# Patient Record
Sex: Female | Born: 1990 | Race: Black or African American | Hispanic: No | Marital: Single | Smoking: Never smoker
Health system: Southern US, Community
[De-identification: ages and names within clinical notes are randomized; demographics above are authoritative.]

## PROBLEM LIST (undated history)

## (undated) ENCOUNTER — Ambulatory Visit (HOSPITAL_COMMUNITY): Source: Home / Self Care

## (undated) DIAGNOSIS — IMO0002 Reserved for concepts with insufficient information to code with codable children: Secondary | ICD-10-CM

## (undated) DIAGNOSIS — R87619 Unspecified abnormal cytological findings in specimens from cervix uteri: Secondary | ICD-10-CM

## (undated) DIAGNOSIS — A599 Trichomoniasis, unspecified: Secondary | ICD-10-CM

## (undated) DIAGNOSIS — E876 Hypokalemia: Secondary | ICD-10-CM

## (undated) HISTORY — DX: Unspecified abnormal cytological findings in specimens from cervix uteri: R87.619

## (undated) HISTORY — PX: NO PAST SURGERIES: SHX2092

## (undated) HISTORY — DX: Reserved for concepts with insufficient information to code with codable children: IMO0002

---
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
## 2008-06-01 ENCOUNTER — Emergency Department (HOSPITAL_COMMUNITY): Admission: EM | Admit: 2008-06-01 | Discharge: 2008-06-01 | Payer: Self-pay | Admitting: Emergency Medicine

## 2008-12-22 ENCOUNTER — Observation Stay (HOSPITAL_COMMUNITY): Admission: EM | Admit: 2008-12-22 | Discharge: 2008-12-22 | Payer: Self-pay | Admitting: Emergency Medicine

## 2009-03-09 ENCOUNTER — Ambulatory Visit: Payer: Self-pay | Admitting: Obstetrics and Gynecology

## 2009-03-09 LAB — CONVERTED CEMR LAB: hCG, Beta Chain, Quant, S: <2 milliintl units/mL

## 2009-03-15 ENCOUNTER — Ambulatory Visit (HOSPITAL_COMMUNITY): Admission: RE | Admit: 2009-03-15 | Discharge: 2009-03-15 | Payer: Self-pay | Admitting: Family Medicine

## 2009-03-23 ENCOUNTER — Ambulatory Visit: Payer: Self-pay | Admitting: Obstetrics and Gynecology

## 2009-07-13 ENCOUNTER — Ambulatory Visit: Payer: Self-pay | Admitting: Obstetrics and Gynecology

## 2009-07-13 LAB — CONVERTED CEMR LAB
Chlamydia, DNA Probe: POSITIVE — AB
hCG, Beta Chain, Quant, S: 79050.9 milliintl units/mL

## 2009-07-14 ENCOUNTER — Encounter: Payer: Self-pay | Admitting: Obstetrics and Gynecology

## 2009-07-18 ENCOUNTER — Ambulatory Visit (HOSPITAL_COMMUNITY): Admission: RE | Admit: 2009-07-18 | Discharge: 2009-07-18 | Payer: Self-pay | Admitting: Family Medicine

## 2009-07-24 ENCOUNTER — Inpatient Hospital Stay (HOSPITAL_COMMUNITY): Admission: AD | Admit: 2009-07-24 | Discharge: 2009-07-24 | Payer: Self-pay | Admitting: Obstetrics & Gynecology

## 2009-07-24 ENCOUNTER — Ambulatory Visit: Payer: Self-pay | Admitting: Advanced Practice Midwife

## 2009-08-24 ENCOUNTER — Ambulatory Visit: Payer: Self-pay | Admitting: Obstetrics and Gynecology

## 2009-08-24 LAB — CONVERTED CEMR LAB: GC Probe Amp, Genital: NEGATIVE

## 2009-08-25 ENCOUNTER — Encounter: Payer: Self-pay | Admitting: Obstetrics and Gynecology

## 2009-08-25 LAB — CONVERTED CEMR LAB
Clue Cells Wet Prep HPF POC: NONE SEEN
Trich, Wet Prep: NONE SEEN
Yeast Wet Prep HPF POC: NONE SEEN

## 2009-10-10 ENCOUNTER — Inpatient Hospital Stay (HOSPITAL_COMMUNITY): Admission: AD | Admit: 2009-10-10 | Discharge: 2009-10-10 | Payer: Self-pay | Admitting: Obstetrics & Gynecology

## 2010-02-27 ENCOUNTER — Inpatient Hospital Stay (HOSPITAL_COMMUNITY)
Admission: AD | Admit: 2010-02-27 | Discharge: 2010-03-01 | Payer: Self-pay | Source: Home / Self Care | Attending: Family Medicine | Admitting: Family Medicine

## 2010-02-28 ENCOUNTER — Ambulatory Visit: Admit: 2010-02-28 | Payer: Self-pay | Admitting: Obstetrics & Gynecology

## 2010-02-28 LAB — CBC
MCHC: 34.9 g/dL (ref 30.0–36.0)
MCV: 91 fL (ref 78.0–100.0)
Platelets: 181 10*3/uL (ref 150–400)
RDW: 13.2 % (ref 11.5–15.5)
WBC: 8.3 10*3/uL (ref 4.0–10.5)

## 2010-02-28 LAB — ABO/RH: ABO/RH(D): O POS

## 2010-02-28 LAB — RPR: RPR Ser Ql: NONREACTIVE

## 2010-04-22 LAB — URINALYSIS, ROUTINE W REFLEX MICROSCOPIC
Ketones, ur: NEGATIVE mg/dL
Nitrite: NEGATIVE

## 2010-04-25 ENCOUNTER — Ambulatory Visit: Payer: Self-pay | Admitting: Obstetrics and Gynecology

## 2010-05-09 ENCOUNTER — Ambulatory Visit: Payer: Self-pay | Admitting: Obstetrics and Gynecology

## 2010-05-09 LAB — COMPREHENSIVE METABOLIC PANEL
ALT: 10 U/L (ref 0–35)
Alkaline Phosphatase: 67 U/L (ref 39–117)
Calcium: 9.3 mg/dL (ref 8.4–10.5)
Chloride: 106 mEq/L (ref 96–112)
Creatinine, Ser: 0.88 mg/dL (ref 0.4–1.2)
GFR calc non Af Amer: >60 mL/min (ref >60)
Potassium: 4.2 mEq/L (ref 3.5–5.1)
Sodium: 137 mEq/L (ref 135–145)

## 2010-05-09 LAB — WET PREP, GENITAL
Trich, Wet Prep: NONE SEEN
Yeast Wet Prep HPF POC: NONE SEEN

## 2010-05-09 LAB — URINE MICROSCOPIC-ADD ON

## 2010-05-09 LAB — CBC
Hemoglobin: 13.8 g/dL (ref 12.0–15.0)
MCHC: 34.4 g/dL (ref 30.0–36.0)
Platelets: 247 10*3/uL (ref 150–400)
RBC: 4.22 MIL/uL (ref 3.87–5.11)

## 2010-05-09 LAB — URINALYSIS, ROUTINE W REFLEX MICROSCOPIC
Glucose, UA: NEGATIVE mg/dL
Urobilinogen, UA: 0.2 mg/dL (ref 0.0–1.0)
pH: 7 (ref 5.0–8.0)

## 2010-05-09 LAB — DIFFERENTIAL
Basophils Absolute: 0.1 10*3/uL (ref 0.0–0.1)
Eosinophils Absolute: 0.1 10*3/uL (ref 0.0–0.7)
Monocytes Absolute: 1.3 10*3/uL — ABNORMAL HIGH (ref 0.1–1.0)
Neutro Abs: 7.6 10*3/uL (ref 1.7–7.7)
Neutrophils Relative %: 72 % (ref 43–77)

## 2010-05-09 LAB — GC/CHLAMYDIA PROBE AMP, GENITAL
Chlamydia, DNA Probe: POSITIVE — AB
GC Probe Amp, Genital: NEGATIVE

## 2010-05-09 LAB — LIPASE, BLOOD: Lipase: 24 U/L (ref 11–59)

## 2010-05-09 LAB — POCT PREGNANCY, URINE: Preg Test, Ur: NEGATIVE

## 2010-05-16 LAB — URINALYSIS, ROUTINE W REFLEX MICROSCOPIC
Bilirubin Urine: NEGATIVE
Glucose, UA: NEGATIVE mg/dL
Ketones, ur: NEGATIVE mg/dL
Nitrite: NEGATIVE
Protein, ur: NEGATIVE mg/dL
Specific Gravity, Urine: 1.022 (ref 1.005–1.030)
Urobilinogen, UA: 1 mg/dL (ref 0.0–1.0)

## 2010-05-16 LAB — URINE MICROSCOPIC-ADD ON

## 2010-05-24 ENCOUNTER — Other Ambulatory Visit: Payer: Self-pay | Admitting: Obstetrics and Gynecology

## 2010-05-24 ENCOUNTER — Ambulatory Visit (INDEPENDENT_AMBULATORY_CARE_PROVIDER_SITE_OTHER): Payer: Self-pay | Admitting: Obstetrics and Gynecology

## 2010-05-24 DIAGNOSIS — N898 Other specified noninflammatory disorders of vagina: Secondary | ICD-10-CM

## 2010-05-24 LAB — POCT PREGNANCY, URINE: Preg Test, Ur: NEGATIVE

## 2010-05-25 NOTE — Group Therapy Note (Signed)
Sara Arroyo, ASANO NO.:  [id number]  MEDICAL RECORD NO.:  [id number]           PATIENT TYPE:  A  LOCATION:  [facility]                   FACILITY:  [facility]  PHYSICIAN:  Argentina Donovan, MD        DATE OF BIRTH:  [birth year]  DATE OF SERVICE:  05/24/2010                                 CLINIC NOTE  The patient is a 20 year old African American female, who delivered a baby by vacuum extraction on January 24 of this year.  Other than that, seems to have an uneventful delivery.  The patient has not nursed, has no real complaints.  On examination, external genitalia is normal.  BUS within normal limits. Vagina, clean with a creamy discharge.  The external buttocks had several small __________ raised lesions, very tiny, indicating molluscum contagiosum.  __________ that would go away.  I asked if she wanted anything to prevent pregnancy, she said no she __________ getting pregnant.  I asked her if she had had intercourse since the baby, she said she had it one time, so we finally settled on Depo-Provera, which she is going to get before she leaves.  I told her that the lesions would go away and we did a test for GC, Chlamydia, as well as wet prep.  IMPRESSION:  Postpartum exam, satisfactory rule out other infectious disease, sexually transmitted disease, molluscum contagiosum present.          ______________________________ Argentina Donovan, MD    PR/MEDQ  D:  05/24/2010  T:  05/25/2010  Job:  [id number]

## 2010-06-23 ENCOUNTER — Inpatient Hospital Stay (HOSPITAL_COMMUNITY)
Admission: AD | Admit: 2010-06-23 | Discharge: 2010-06-23 | Disposition: A | Discharge status: Home / Self Care | Payer: Medicaid Other | Source: Ambulatory Visit | Attending: Obstetrics & Gynecology | Admitting: Obstetrics & Gynecology

## 2010-06-23 DIAGNOSIS — K5289 Other specified noninfective gastroenteritis and colitis: Secondary | ICD-10-CM | POA: Insufficient documentation

## 2010-06-23 DIAGNOSIS — R112 Nausea with vomiting, unspecified: Secondary | ICD-10-CM | POA: Insufficient documentation

## 2010-06-23 LAB — URINE MICROSCOPIC-ADD ON

## 2010-06-23 LAB — COMPREHENSIVE METABOLIC PANEL
Albumin: 4.6 g/dL (ref 3.5–5.2)
BUN: 12 mg/dL (ref 6–23)
Calcium: 10 mg/dL (ref 8.4–10.5)
Creatinine, Ser: 0.92 mg/dL (ref 0.4–1.2)
Potassium: 3.4 mEq/L — ABNORMAL LOW (ref 3.5–5.1)
Total Protein: 8.1 g/dL (ref 6.0–8.3)

## 2010-06-23 LAB — CBC
MCH: 31 pg (ref 26.0–34.0)
MCHC: 34.9 g/dL (ref 30.0–36.0)
MCV: 89.1 fL (ref 78.0–100.0)
Platelets: 269 10*3/uL (ref 150–400)
RDW: 12.6 % (ref 11.5–15.5)
WBC: 5.4 10*3/uL (ref 4.0–10.5)

## 2010-06-23 LAB — WET PREP, GENITAL
Clue Cells Wet Prep HPF POC: NONE SEEN
Trich, Wet Prep: NONE SEEN
Yeast Wet Prep HPF POC: NONE SEEN

## 2010-06-23 LAB — URINALYSIS, ROUTINE W REFLEX MICROSCOPIC
Nitrite: NEGATIVE
Specific Gravity, Urine: >1.03 — ABNORMAL HIGH (ref 1.005–1.030)
pH: 6 (ref 5.0–8.0)

## 2010-07-13 ENCOUNTER — Ambulatory Visit: Payer: Self-pay | Admitting: Family Medicine

## 2010-08-09 ENCOUNTER — Ambulatory Visit: Payer: Self-pay

## 2010-08-20 ENCOUNTER — Ambulatory Visit (INDEPENDENT_AMBULATORY_CARE_PROVIDER_SITE_OTHER): Payer: Medicaid Other | Admitting: *Deleted

## 2010-08-20 VITALS — BP 108/65 | HR 69 | Temp 100.4°F | Wt 113.7 lb

## 2010-08-20 DIAGNOSIS — Z3049 Encounter for surveillance of other contraceptives: Secondary | ICD-10-CM

## 2010-08-20 DIAGNOSIS — IMO0001 Reserved for inherently not codable concepts without codable children: Secondary | ICD-10-CM

## 2010-08-20 MED ORDER — MEDROXYPROGESTERONE ACETATE 150 MG/ML IM SUSP
150.0000 mg | Freq: Once | INTRAMUSCULAR | Status: AC
  Administered 2010-08-20: 150 mg via INTRAMUSCULAR

## 2010-11-09 ENCOUNTER — Ambulatory Visit: Payer: Medicaid Other

## 2010-11-12 ENCOUNTER — Ambulatory Visit: Payer: Medicaid Other

## 2010-11-15 ENCOUNTER — Encounter: Payer: Self-pay | Admitting: *Deleted

## 2010-11-16 ENCOUNTER — Ambulatory Visit: Payer: Medicaid Other

## 2010-11-17 ENCOUNTER — Emergency Department (HOSPITAL_COMMUNITY)
Admission: EM | Admit: 2010-11-17 | Discharge: 2010-11-17 | Disposition: A | Discharge status: Home / Self Care | Payer: Medicaid Other | Attending: Emergency Medicine | Admitting: Emergency Medicine

## 2010-11-17 DIAGNOSIS — R0982 Postnasal drip: Secondary | ICD-10-CM | POA: Insufficient documentation

## 2010-11-17 DIAGNOSIS — R05 Cough: Secondary | ICD-10-CM | POA: Insufficient documentation

## 2010-11-17 DIAGNOSIS — J329 Chronic sinusitis, unspecified: Secondary | ICD-10-CM | POA: Insufficient documentation

## 2010-11-17 DIAGNOSIS — R509 Fever, unspecified: Secondary | ICD-10-CM | POA: Insufficient documentation

## 2010-11-17 DIAGNOSIS — J3489 Other specified disorders of nose and nasal sinuses: Secondary | ICD-10-CM | POA: Insufficient documentation

## 2010-11-17 DIAGNOSIS — H9209 Otalgia, unspecified ear: Secondary | ICD-10-CM | POA: Insufficient documentation

## 2010-11-17 DIAGNOSIS — R059 Cough, unspecified: Secondary | ICD-10-CM | POA: Insufficient documentation

## 2010-11-17 DIAGNOSIS — R51 Headache: Secondary | ICD-10-CM | POA: Insufficient documentation

## 2010-11-19 ENCOUNTER — Ambulatory Visit (INDEPENDENT_AMBULATORY_CARE_PROVIDER_SITE_OTHER): Payer: Medicaid Other | Admitting: *Deleted

## 2010-11-19 VITALS — BP 101/67 | HR 67

## 2010-11-19 DIAGNOSIS — Z3049 Encounter for surveillance of other contraceptives: Secondary | ICD-10-CM

## 2010-11-19 MED ORDER — MEDROXYPROGESTERONE ACETATE 150 MG/ML IM SUSP
150.0000 mg | Freq: Once | INTRAMUSCULAR | Status: AC
  Administered 2010-11-19: 150 mg via INTRAMUSCULAR

## 2011-01-04 ENCOUNTER — Telehealth: Payer: Self-pay | Admitting: *Deleted

## 2011-01-04 NOTE — Telephone Encounter (Signed)
Pt left message that she has questions about the Depo.  She requests call back ASAP.  She did not state her questions.  I returned pt call and left message that I will call her again before we close @ 1200.  If I do not reach her, we will re-open @ 0800 on Monday.

## 2011-01-04 NOTE — Telephone Encounter (Signed)
I left another message for pt that I was trying to contact her and discuss her concerns/questions.  Please call back to nurse voice mail and state her specific questions. She will be contacted on Monday.

## 2011-01-07 NOTE — Telephone Encounter (Signed)
Called [place] and left a message we are returning your call, please call again and we will try to answer your questions.

## 2011-01-07 NOTE — Telephone Encounter (Signed)
Patient called and left a message on nurse voicemail on Friday, November 30 at 12:39pm that she was returning our call. Call was retrieved today.

## 2011-01-08 NOTE — Telephone Encounter (Signed)
Telephone pt at home # left message to return call.

## 2011-01-10 ENCOUNTER — Telehealth: Payer: Self-pay | Admitting: *Deleted

## 2011-01-10 NOTE — Telephone Encounter (Signed)
Sara Arroyo called and left a message stating she is returning a 3rd or 4th call- re: her question is about the depoprovera shot she is on- states she has been on it since April- has not had a period since then, now I'm bleeding heavy all of a sudden- I want to know why- I 'm letting you know I do have an appointment set up for  January, but I want to know now.  I was told that after my first or second injection I would not have any more periods. I have headaches , back pains, Please call me if and If I don't answer leave a message what you think it is.

## 2011-01-10 NOTE — Telephone Encounter (Signed)
Called [place] back and left a message we are returning your call, sorry we missed you again, but we need to talk to you instead of just leaving a message, however it is normal to still have a period after starting depoprovera- not everyone stops having periods. Please call clinic to discuss with [place] your complaints of heavy bleeding

## 2011-01-11 NOTE — Telephone Encounter (Signed)
Called pt and left message as instructed by pt and informed her that it is normal to still have a period while on the depo provera injection.  And if she has any further questions to please give [place] a call back at the clinics.

## 2011-02-11 ENCOUNTER — Ambulatory Visit (INDEPENDENT_AMBULATORY_CARE_PROVIDER_SITE_OTHER): Payer: Medicaid Other | Admitting: *Deleted

## 2011-02-11 VITALS — BP 113/63 | HR 92

## 2011-02-11 DIAGNOSIS — Z3049 Encounter for surveillance of other contraceptives: Secondary | ICD-10-CM

## 2011-02-11 MED ORDER — MEDROXYPROGESTERONE ACETATE 150 MG/ML IM SUSP
150.0000 mg | Freq: Once | INTRAMUSCULAR | Status: AC
  Administered 2011-02-11: 150 mg via INTRAMUSCULAR

## 2011-04-29 ENCOUNTER — Ambulatory Visit (INDEPENDENT_AMBULATORY_CARE_PROVIDER_SITE_OTHER): Payer: Medicaid Other | Admitting: *Deleted

## 2011-04-29 VITALS — BP 110/67 | HR 72 | Temp 98.2°F | Wt 111.3 lb

## 2011-04-29 DIAGNOSIS — Z3049 Encounter for surveillance of other contraceptives: Secondary | ICD-10-CM

## 2011-04-29 MED ORDER — MEDROXYPROGESTERONE ACETATE 150 MG/ML IM SUSP
150.0000 mg | Freq: Once | INTRAMUSCULAR | Status: AC
  Administered 2011-04-29: 150 mg via INTRAMUSCULAR

## 2011-04-30 ENCOUNTER — Telehealth: Payer: Self-pay | Admitting: *Deleted

## 2011-04-30 NOTE — Telephone Encounter (Signed)
Pt left message for a nurse to call back ASAP.

## 2011-05-01 NOTE — Telephone Encounter (Signed)
Returned call to pt and left message to leave a new voice mail message and state her question or problem. We will call her back.

## 2011-05-02 NOTE — Telephone Encounter (Signed)
Called pt and left message stating that this is our second attempt in returning your call if she has concerns or questions to please give [place] a call at the clinics.

## 2011-07-15 ENCOUNTER — Ambulatory Visit: Payer: Medicaid Other | Admitting: Physician Assistant

## 2011-07-16 ENCOUNTER — Ambulatory Visit (INDEPENDENT_AMBULATORY_CARE_PROVIDER_SITE_OTHER): Payer: Medicaid Other | Admitting: General Practice

## 2011-07-16 VITALS — BP 99/57 | HR 72 | Ht 61.0 in | Wt 107.0 lb

## 2011-07-16 DIAGNOSIS — Z309 Encounter for contraceptive management, unspecified: Secondary | ICD-10-CM

## 2011-07-16 DIAGNOSIS — Z3049 Encounter for surveillance of other contraceptives: Secondary | ICD-10-CM

## 2011-07-16 MED ORDER — MEDROXYPROGESTERONE ACETATE 150 MG/ML IM SUSP
150.0000 mg | Freq: Once | INTRAMUSCULAR | Status: AC
  Administered 2011-07-16: 150 mg via INTRAMUSCULAR

## 2011-08-30 ENCOUNTER — Ambulatory Visit (INDEPENDENT_AMBULATORY_CARE_PROVIDER_SITE_OTHER): Payer: Medicaid Other | Admitting: Advanced Practice Midwife

## 2011-08-30 ENCOUNTER — Other Ambulatory Visit (HOSPITAL_COMMUNITY)
Admission: RE | Admit: 2011-08-30 | Discharge: 2011-08-30 | Disposition: A | Discharge status: Home / Self Care | Payer: Medicaid Other | Source: Ambulatory Visit | Attending: Advanced Practice Midwife | Admitting: Advanced Practice Midwife

## 2011-08-30 VITALS — BP 113/68 | HR 89 | Temp 99.1°F | Ht 61.0 in | Wt 107.0 lb

## 2011-08-30 DIAGNOSIS — Z113 Encounter for screening for infections with a predominantly sexual mode of transmission: Secondary | ICD-10-CM | POA: Insufficient documentation

## 2011-08-30 DIAGNOSIS — Z01419 Encounter for gynecological examination (general) (routine) without abnormal findings: Secondary | ICD-10-CM | POA: Insufficient documentation

## 2011-08-30 DIAGNOSIS — R87810 Cervical high risk human papillomavirus (HPV) DNA test positive: Secondary | ICD-10-CM | POA: Insufficient documentation

## 2011-08-30 DIAGNOSIS — R634 Abnormal weight loss: Secondary | ICD-10-CM

## 2011-08-30 LAB — TSH: TSH: 1.243 u[IU]/mL (ref 0.350–4.500)

## 2011-08-30 MED ORDER — MEDROXYPROGESTERONE ACETATE 150 MG/ML IM SUSP: 150.0000 mg | Freq: Once | INTRAMUSCULAR | Status: DC

## 2011-08-30 NOTE — Patient Instructions (Addendum)
Health Maintenance, 18- to 23-Year-Old SCHOOL PERFORMANCE After high school completion, the young adult may be attending college, technical or vocational school, or entering the military or the work force. SOCIAL AND EMOTIONAL DEVELOPMENT The young adult establishes adult relationships and explores sexual identity. Young adults may be living at home or in a college dorm or apartment. Increasing independence is important with young adults. Throughout adolescence, teens should assume responsibility of their own health care. IMMUNIZATIONS Most young adults should be fully vaccinated. A booster dose of Tdap (tetanus, diphtheria, and pertussis, or "whooping cough"), a dose of meningococcal vaccine to protect against a certain type of bacterial meningitis, hepatitis A, human papillomarvirus (HPV), chickenpox, or measles vaccines may be indicated, if not given at an earlier age. Annual influenza or "flu" vaccination should be considered during flu season.  TESTING Annual screening for vision and hearing problems is recommended. Vision should be screened objectively at least once between 18 and 23 years of age. The young adult may be screened for anemia or tuberculosis. Young adults should have a blood test to check for high cholesterol during this time period. Young adults should be screened for use of alcohol and drugs. If the young adult is sexually active, screening for sexually transmitted infections, pregnancy, or HIV may be performed. Screening for cervical cancer should be performed within 3 years of beginning sexual activity. NUTRITION AND ORAL HEALTH  Adequate calcium intake is important. Consume 3 servings of low-fat milk and dairy products daily. For those who do not drink milk or consume dairy products, calcium enriched foods, such as juice, bread, or cereal, dark, leafy greens, or canned fish are alternate sources of calcium.   Drink plenty of water. Limit fruit juice to 8 to 12 ounces per day.  Avoid sugary beverages or sodas.   Discourage skipping meals, especially breakfast. Teens should eat a good variety of vegetables and fruits, as well as lean meats.   Avoid high fat, high salt, and high sugar foods, such as candy, chips, and cookies.   Encourage young adults to participate in meal planning and preparation.   Eat meals together as a family whenever possible. Encourage conversation at mealtime.   Limit fast food choices and eating out at restaurants.   Brush teeth twice a day and floss.   Schedule dental exams twice a year.  SLEEP Regular sleep habits are important. PHYSICAL, SOCIAL, AND EMOTIONAL DEVELOPMENT  One hour of regular physical activity daily is recommended. Continue to participate in sports.   Encourage young adults to develop their own interests and consider community service or volunteerism.   Provide guidance to the young adult in making decisions about college and work plans.   Make sure that young adults know that they should never be in a situation that makes them uncomfortable, and they should tell partners if they do not want to engage in sexual activity.   Talk to the young adult about body image. Eating disorders may be noted at this time. Young adults may also be concerned about being overweight. Monitor the young adult for weight gain or loss.   Mood disturbances, depression, anxiety, alcoholism, or attention problems may be noted in young adults. Talk to the caregiver if there are concerns about mental illness.   Negotiate limit setting and independent decision making.   Encourage the young adult to handle conflict without physical violence.   Avoid loud noises which may impair hearing.   Limit television and computer time to 2 hours per day.   Individuals who engage in excessive sedentary activity are more likely to become overweight.  RISK BEHAVIORS  Sexually active young adults need to take precautions against pregnancy and sexually  transmitted infections. Talk to young adults about contraception.   Provide a tobacco-free and drug-free environment for the young adult. Talk to the young adult about drug, tobacco, and alcohol use among friends or at friends' homes. Make sure the young adult knows that smoking tobacco or marijuana and taking drugs have health consequences and may impact brain development.   Teach the young adult about appropriate use of over-the-counter or prescription medicines.   Establish guidelines for driving and for riding with friends.   Talk to young adults about the risks of drinking and driving or boating. Encourage the young adult to call you if he or she or friends have been drinking or using drugs.   Remind young adults to wear seat belts at all times in cars and life vests in boats.   Young adults should always wear a properly fitted helmet when they are riding a bicycle.   Use caution with all-terrain vehicles (ATVs) or other motorized vehicles.   Do not keep handguns in the home. (If you do, the gun and ammunition should be locked separately and out of the young adult's access.)   Equip your home with smoke detectors and change the batteries regularly. Make sure all family members know the fire escape plans for your home.   Teach young adults not to swim alone and not to dive in shallow water.   All individuals should wear sunscreen that protects against UVA and UVB light with at least a sun protection factor (SPF) of 30 when out in the sun. This minimizes sun burning.  WHAT'S NEXT? Young adults should visit their pediatrician or family physician yearly. By young adulthood, health care should be transitioned to a family physician or internal medicine specialist. Sexually active females may want to begin annual physical exams with a gynecologist. Document Released: 04/18/2006 Document Revised: 01/10/2011 Document Reviewed: 05/08/2006 [employer] Patient Information 2012 [employer]. 

## 2011-08-30 NOTE — Progress Notes (Signed)
  Subjective:     Sara Arroyo is a 21 y.o. female here for a routine exam.  Current complaints: unable to gain weight. States she eats "alot" but cannot gain weight since giving birth early in 2012.  Personal health questionnaire reviewed: not asked.   Gynecologic History No LMP recorded. Patient has had an injection. Contraception: Depo-Provera injections Last Pap: 2011. 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 Results were:   Obstetric History OB History    Grav Para Term Preterm Abortions TAB SAB Ect Mult Living   2 1 1  1 1    1      # Outc Date GA Lbr Len/2nd Wgt Sex Del Anes PTL Lv   1 TAB 2008           2 TRM 1/12     VAC          The following portions of the patient's history were reviewed and updated as appropriate: allergies, current medications, past family history, past medical history, past social history, past surgical history and problem list.  Review of Systems Pertinent items are noted in HPI.    Objective:    General appearance: alert and no distress Neck: no adenopathy, no JVD, supple, symmetrical, trachea midline and thyroid not enlarged, symmetric, no tenderness/mass/nodules Breasts: normal appearance, no masses or tenderness Abdomen: soft, non-tender; bowel sounds normal; no masses,  no organomegaly Pelvic: cervix normal in appearance, external genitalia normal, no adnexal masses or tenderness, no cervical motion tenderness, rectovaginal septum normal, uterus normal size, shape, and consistency and vagina normal without discharge Skin: Skin color, texture, turgor normal. No rashes or lesions    Assessment:    Healthy female exam.  Unintentional inability to gain weight   Plan:  Discussed need to  Have TSH drawn.   Discussed need to have Family Doctor review her overall health history and evaluate the weight issue.  She has a [employer]cl[employer] group her daughter goes to so she will call them. Pap with cultures sent   Contraception:  Depo-Provera injections. Follow up in: 6 months.

## 2011-09-09 ENCOUNTER — Telehealth: Payer: Self-pay | Admitting: *Deleted

## 2011-09-09 NOTE — Telephone Encounter (Signed)
Message copied by Gerome Apley on Mon Sep 09, 2011 11:30 AM ------      Message from: [facility]      Created: Fri Sep 06, 2011  9:51 AM      Regarding: Not sure who calls pt Re: Abnormal pap,needs colpo       LGSIL pap.            Needs Colpo            GC/CHl both Negative            HR HPV  Positive            Thanks

## 2011-09-11 NOTE — Telephone Encounter (Signed)
Patient returned call. I informed her of the abnormal pap result and explained in detail the colposcopy procedure. Pt was upset and keep saying that she just didn't understand. I explained again and told her about HPV. Pt stated that she understands and will come to the colpo appt. Also aware that depo appt was changed.

## 2011-09-11 NOTE — Telephone Encounter (Signed)
Colpo appt has been made for 10/10/11 @ 1:15 pm. Pt will still be in her "window" for depo and will receive her shot at this appt also. Original depo appointment on 10/01/11 has been cancelled. LM for patient to return call to clinic for appt information. Pt needs to be advised of colpo appt and depo appt change.

## 2011-10-01 ENCOUNTER — Ambulatory Visit: Payer: Medicaid Other

## 2011-10-06 HISTORY — PX: COLPOSCOPY: SHX161

## 2011-10-10 ENCOUNTER — Ambulatory Visit (INDEPENDENT_AMBULATORY_CARE_PROVIDER_SITE_OTHER): Payer: Medicaid Other | Admitting: Obstetrics & Gynecology

## 2011-10-10 ENCOUNTER — Other Ambulatory Visit (HOSPITAL_COMMUNITY)
Admission: RE | Admit: 2011-10-10 | Discharge: 2011-10-10 | Disposition: A | Discharge status: Home / Self Care | Payer: Medicaid Other | Source: Ambulatory Visit | Attending: Obstetrics & Gynecology | Admitting: Obstetrics & Gynecology

## 2011-10-10 ENCOUNTER — Encounter: Payer: Self-pay | Admitting: Obstetrics & Gynecology

## 2011-10-10 VITALS — BP 104/67 | HR 93 | Temp 99.6°F | Ht 60.0 in | Wt 111.0 lb

## 2011-10-10 DIAGNOSIS — IMO0002 Reserved for concepts with insufficient information to code with codable children: Secondary | ICD-10-CM

## 2011-10-10 DIAGNOSIS — N870 Mild cervical dysplasia: Secondary | ICD-10-CM | POA: Insufficient documentation

## 2011-10-10 DIAGNOSIS — Z01812 Encounter for preprocedural laboratory examination: Secondary | ICD-10-CM

## 2011-10-10 DIAGNOSIS — R87612 Low grade squamous intraepithelial lesion on cytologic smear of cervix (LGSIL): Secondary | ICD-10-CM

## 2011-10-10 DIAGNOSIS — Z3049 Encounter for surveillance of other contraceptives: Secondary | ICD-10-CM

## 2011-10-10 MED ORDER — MEDROXYPROGESTERONE ACETATE 150 MG/ML IM SUSP
150.0000 mg | Freq: Once | INTRAMUSCULAR | Status: AC
  Administered 2011-10-10: 150 mg via INTRAMUSCULAR

## 2011-10-10 NOTE — Addendum Note (Signed)
Addended by: Sherre Lain A on: 10/10/2011 02:41 PM   Modules accepted: Orders

## 2011-10-10 NOTE — Patient Instructions (Signed)
Cervical Dysplasia Cervical dysplasia is a condition in which a woman has abnormal changes in the cells of her cervix. The cervix is the opening to the uterus (womb) between the vagina and the uterus. These changes are called cervical dysplasia and may be the first signs of cervical cancer. These cells can be taken from the cervix during a Pap test and then looked at under a microscope. With early detection, treatment, and close follow-up care, nearly all cervical dysplasia can be cured. If untreated, the mild to moderate stages of dysplasia often grow more severe.  RISK FACTORS  The following increase the risk for cervical dysplasia.  Having had a sexually transmitted disease, including:   Chlamydia.   Human papilloma virus (HPV).   Becoming sexually active before age 18.   Having had more than 1 sexual partner.   Not using protection, such as condoms, during sexual intercourse, especially with new sexual partners.   Having had cancer of the vagina or vulva.   Having a sexual partner whose previous partner had cancer of the cervix or cervical dysplasia.   Having a sexual partner who has or has had cancer of the penis.   Having a weakened immune system (HIV, organ transplant).   Being the daughter of a woman who took DES (diethylstilbestrol) during pregnancy.   A history of cervical cancer in a woman's sister or mother.   Smoking.   Having had an abnormal Pap test in the past.  SYMPTOMS  There are usually no symptoms. If there are symptoms, they may be vague such as:  Abnormal vaginal discharge.   Bleeding between periods or following intercourse.   Bleeding during menopause.   Pain on intercourse (dyspareunia).  DIAGNOSIS   The Pap test is the best way of detecting abnormalities of the cervix.   Biopsy (removing a piece of tissue to look at under the microscope) of the cervix when the Pap test is abnormal or when the Pap test is normal, but the cervix looks abnormal.    TREATMENT  Catching and treating the changes early with Pap tests can prevent cervical cancer.  Cryotherapy freezes the abnormal cells with a steel tip instrument.   A laser can be used to remove the abnormal cells.   Loop electrocautery excision procedure (LEEP). This procedure uses a heated electrical loop to remove a cone-like portion of the cervix, including the cervical canal.   For more serious cases of cervical dysplasia, the abnormal tissue may be removed surgically by:   A cone biopsy (by cold knife, laser or LEEP). A procedure in which a portion of the center of the cervix with the cervical canal is removed.   The uterus and cervix are removed (hysterectomy).  Your caregiver will advise you regarding the need and timing of Pap tests in your follow-up. Women who have been treated for dysplasia should be closely followed with pelvic exams and Pap tests. During the first year following treatment of cervical dysplasia, Pap tests should be done every 3 to 4 months. In the second year, the schedule is every 6 months, or as recommended by your caregiver. See your caregiver for new or worsening problems. HOME CARE INSTRUCTIONS   Follow the instructions and recommendations of your caregiver regarding medicines and follow-up appointments.   Only take over-the-counter or prescription medicines for pain or discomfort as directed by your caregiver.   Cramping and pelvic discomfort may follow cryotherapy. It is not abnormal to have watery discharge for several weeks after.     Laser, cone surgery, cryotherapy or LEEP can cause a bad smelling vaginal discharge. It may also cause vaginal bleeding for a couple weeks following the procedure. The discharge may be black from the paste used to control bleeding from the cone site. This is normal.   Do not use tampons, have sexual intercourse or douche until your caregiver says it is okay.  SEEK MEDICAL CARE IF:   You develop genital warts.   You  need a prescription for pain medicine following your treatment.  SEEK IMMEDIATE MEDICAL CARE IF:   Your bleeding is heavier than a normal menstrual period.   You develop bright red bleeding, especially if you have blood clots.   You have a fever.   You have increasing cramps or pain not relieved with medicine.   You are lightheaded, unusually weak, or have fainting spells.   You have abnormal vaginal discharge.   You develop abdominal pain.  PREVENTION   The surest way to prevent cervical dysplasia is to abstain from sexual intercourse.   Practice safe sex, use condoms and have only one sex partner who does not have other sex partners.   A Pap test is done to screen for cervical cancer.   The first Pap test should be done at age 21.   Between ages 21 and 29, Pap tests are repeated every 2 years.   Beginning at age 30, you are advised to have a Pap test every 3 years as long as your past 3 Pap tests have been normal.   Some women have medical problems that increase the chance of getting cervical cancer. Talk to your caregiver about these problems. It is especially important to talk to your caregiver if a new problem develops soon after your last Pap test. In these cases, your caregiver may recommend more frequent screening and Pap tests.   The above recommendations are the same for women who have or have not gotten the vaccine for HPV (Human Papillomavirus).   If you had a hysterectomy for a problem that was not a cancer or a condition that could lead to cancer, then you no longer need Pap tests. However, even if you no longer need a Pap test, a regular exam is a good idea to make sure no other problems are starting.    If you are between ages 65 and 70, and you have had normal Pap tests going back 10 years, you no longer need Pap tests. However, even if you no longer need a Pap test, a regular exam is a good idea to make sure no other problems are starting.    If you have  had past treatment for cervical cancer or a condition that could lead to cancer, you need Pap tests and screening for cancer for at least 20 years after your treatment.   If Pap tests have been discontinued, risk factors (such as a new sexual partner) need to be re-assessed to determine if screening should be resumed.   Some women may need screenings more often if they are at high risk for cervical cancer.   Your caregiver may do additional tests including:   Colposcopy. A procedure in which a special microscope magnifies the cells and allows the provider to closely examine the cervix, vagina, and vulva.   Biopsy. A small tissue sample is taken from the cervix, vagina or vulva. This is generally done in your caregivers office.   A cone biopsy (cold knife or laser). A large tissue sample is   taken from the cervix. This procedure is usually done in an operating room under a general anesthetic. The cone often removes all abnormal tissue and so may also complete the treatment.   LEEP, also removing a circular portion of the cervix and is done in a doctors office under a local anesthetic.   Now there is a vaccine, Gardasil, that was developed to prevent the HPV'S that can cause cancer of the cervix and genital warts. It is recommended for females ages 75 to 68. It should not be given to pregnant women until more is known about its effects on the fetus. Not all cancers of the cervix are caused by the HPV. Routine gynecology exams and Pap tests should continue as recommended by your caregiver.  Document Released: 01/21/2005 Document Revised: 01/10/2011 Document Reviewed: 01/13/2008 [facility] Patient Information 2012 [place], [place].Colposcopy Colposcopy is a procedure that uses a special lighted microscope (colposcope). It examines your cervix and vagina, or the area around the outside of the vagina, for signs of disease or abnormalities in the cells. You may be sent to a specialist (gynecologist) to do  the colposcopy. A biopsy (tissue sample) may be collected during a colposcopy, if the caregiver finds any unusual cells. The biopsy is sent to the lab for further testing, and the results are reported back to your caregiver. A WOMAN MAY NEED THIS PROCEDURE IF:  She has had an abnormal pap smear (taking cells from the cervix for testing).   She has a sore on her cervix, and a Pap test was normal.   The Pap test suggests human papilloma virus (HPV). This virus can cause genital warts and is linked to the development of cervical cancer.   She has genital warts on the cervix, or in or around the outside of the vagina.   Her mother took the drug DES while pregnant.   She has painful intercourse.   She has vaginal bleeding, especially after sexual intercourse.   There is a need to evaluate the results of previous treatment.  BEFORE THE PROCEDURE   Colposcopy is done when you are not having a menstrual period.   For 24 hours before the colposcopy, do not:   Douche.   Use tampons.   Use medicines, creams, or suppositories in the vagina.   Have sexual intercourse.  PROCEDURE   A colposcopy is done while a woman is lying on her back with her feet in foot rests (stirrups).   A speculum is placed inside the vagina to keep it open and to allow the caregiver to see the cervix. This is the same instrument used to do a pap smear.   The colposcope is placed outside the vagina. It is used to magnify and examine the cervix, vagina, and the area around the outside of the vagina.   A small amount of liquid solution is placed on the area that is to be viewed. This solution is placed on with a cotton applicator. This solution makes it easier to see the abnormal cells.   Your caregiver will suck out mucus and cells from the canal of the cervix.   Small pieces of tissue for biopsy may be taken at the same time. You may feel mild pain or discomfort when this is done.   Your caregiver will record  the location of the abnormal areas and send the tissue samples to a lab for analysis.   If your caregiver biopsies the vagina or outside of the vagina, a local anesthetic (novocaine)  is usually given.  AFTER THE PROCEDURE   You may have some cramping that often goes away in a few minutes. You may have some soreness for a couple of days.   You may take over-the-counter pain medicine as advised by your caregiver. Do not take aspirin because it can cause bleeding.   Lie down for a few minutes if you feel lightheaded.   You may have some bleeding or dark discharge that should stop in a few days.   You may need to wear a sanitary pad for a few days.  HOME CARE INSTRUCTIONS   Avoid sex, douching, and using tampons for a week or as directed.   Only take medicine as directed by your caregiver.   Continue to take birth control pills, if you are on them.   Not all test results are available during your visit. If your test results are not back during the visit, make an appointment with your caregiver to find out the results. Do not assume everything is normal if you have not heard from your caregiver or the medical facility. It is important for you to follow up on all of your test results.   Follow your caregiver's advice regarding medicines, activity, follow-up visits, and follow-up Pap tests.  SEEK MEDICAL CARE IF:   You develop a rash.   You have problems with your medicine.  SEEK IMMEDIATE MEDICAL CARE IF:  You are bleeding heavily or are passing blood clots.   You develop a fever over 102 F (38.9 C), with or without chills.   You have abnormal vaginal discharge.   You are having cramps that do not go away after taking your pain medicine.   You feel lightheaded, dizzy, or faint.   You develop stomach pain.  Document Released: 04/13/2002 Document Revised: 01/10/2011 Document Reviewed: 11/24/2008 [facility] Patient Information 2012 [place], [place].

## 2011-10-10 NOTE — Progress Notes (Signed)
Patient ID: Aggie Moats, female   DOB: April 29, 1990, 21 y.o.   MRN: [id number]  Chief Complaint  Patient presents with  . Colposcopy    HPI TASNIA SPEGAL is a 21 y.o. female who presents for colpo  HPI  Indications: Pap smear on July 2013 showed: low-grade squamous intraepithelial neoplasia (LGSIL - encompassing HPV,mild dysplasia,CIN I).  No past medical history on file.  No past surgical history on file.  No family history on file.  Social History History  Substance Use Topics  . Smoking status: Never Smoker   . Smokeless tobacco: Never Used  . Alcohol Use: No    No Known Allergies  Current Outpatient Prescriptions  Medication Sig Dispense Refill  . medroxyPROGESTERone (DEPO-PROVERA) 150 MG/ML injection Inject 1 mL (150 mg total) into the muscle once.  1 mL  4    Review of Systems Review of Systems  Blood pressure 104/67, pulse 93, temperature 99.6 F (37.6 C), temperature source Oral, height 5' (1.524 m), weight 111 lb (50.349 kg).  Physical Exam Physical Exam  Data Reviewed cytology  Assessment   suspect bx c/w low grade dysplasia  Procedure Details  The risks and benefits of the procedure and Written informed consent obtained.  Speculum placed in vagina and excellent visualization of cervix achieved, cervix swabbed x 3 with acetic acid solution.  A full rim of acetowhite changes were noted.  No vascular changes noted with green filter.  bx x 2 taken.    Specimens: bx x 2 @ 7 and 3 Complications: none.     Plan    Specimens labelled and sent to Pathology. Return to discuss Pathology results in 2 weeks.      HARRAWAY-SMITH, Javayah Magaw 10/10/2011, 2:24 PM

## 2011-10-11 LAB — POCT PREGNANCY, URINE: Preg Test, Ur: NEGATIVE

## 2011-10-15 ENCOUNTER — Telehealth: Payer: Self-pay | Admitting: *Deleted

## 2011-10-15 NOTE — Telephone Encounter (Signed)
Message copied by Jill Side on Tue Oct 15, 2011 12:38 PM ------      Message from: Willodean Rosenthal      Created: Tue Oct 15, 2011 11:26 AM       Please call pt.  Notify her of low grade changes on PAP. She needs a repeat PAP with HPV in 1 year.            Thx,      clh-S

## 2011-10-15 NOTE — Telephone Encounter (Signed)
Called pt and left message to call us back and indicate when she can be reached or if we may leave test result information on her voice mail.

## 2011-10-15 NOTE — Telephone Encounter (Signed)
Pt left message @ 1242 stating that she had missed a call.  I returned her call now and got voice mail again. I stated that I will call back or she may leave a message if she would like the information to be left on her voice mail. If so, she will need to provide her DOB and last 4 #'s of SS# in her return message.

## 2011-10-17 NOTE — Telephone Encounter (Signed)
Called pt and informed pt of low grade changes on her PAP and that she would need a repeat pap with HPV in 1 year.  Pt stated understanding and did not have any other questions.

## 2011-12-26 ENCOUNTER — Other Ambulatory Visit (HOSPITAL_COMMUNITY)
Admission: RE | Admit: 2011-12-26 | Discharge: 2011-12-26 | Disposition: A | Discharge status: Home / Self Care | Payer: Medicaid Other | Source: Ambulatory Visit | Attending: Obstetrics & Gynecology | Admitting: Obstetrics & Gynecology

## 2011-12-26 ENCOUNTER — Ambulatory Visit (INDEPENDENT_AMBULATORY_CARE_PROVIDER_SITE_OTHER): Payer: Medicaid Other | Admitting: Obstetrics & Gynecology

## 2011-12-26 VITALS — BP 102/50 | HR 71 | Temp 98.7°F | Resp 20 | Ht 60.5 in | Wt 110.6 lb

## 2011-12-26 DIAGNOSIS — Z3049 Encounter for surveillance of other contraceptives: Secondary | ICD-10-CM

## 2011-12-26 DIAGNOSIS — N760 Acute vaginitis: Secondary | ICD-10-CM | POA: Insufficient documentation

## 2011-12-26 DIAGNOSIS — Z113 Encounter for screening for infections with a predominantly sexual mode of transmission: Secondary | ICD-10-CM | POA: Insufficient documentation

## 2011-12-26 DIAGNOSIS — N898 Other specified noninflammatory disorders of vagina: Secondary | ICD-10-CM

## 2011-12-26 DIAGNOSIS — A499 Bacterial infection, unspecified: Secondary | ICD-10-CM

## 2011-12-26 DIAGNOSIS — B9689 Other specified bacterial agents as the cause of diseases classified elsewhere: Secondary | ICD-10-CM

## 2011-12-26 DIAGNOSIS — B373 Candidiasis of vulva and vagina: Secondary | ICD-10-CM

## 2011-12-26 DIAGNOSIS — B3731 Acute candidiasis of vulva and vagina: Secondary | ICD-10-CM

## 2011-12-26 DIAGNOSIS — Z3042 Encounter for surveillance of injectable contraceptive: Secondary | ICD-10-CM

## 2011-12-26 MED ORDER — MEDROXYPROGESTERONE ACETATE 150 MG/ML IM SUSP
150.0000 mg | INTRAMUSCULAR | Status: DC
  Administered 2011-12-26: 150 mg via INTRAMUSCULAR

## 2011-12-26 NOTE — Progress Notes (Signed)
Pt here for scheduled Depo injection and reports that she has been having a vaginal odor since her last appt for colpo on 10/10/11.  She also reports that she has been having bleeding since that visit as well and would like to be seen by the doctor today.

## 2011-12-26 NOTE — Patient Instructions (Signed)
Dysfunctional Uterine Bleeding  Normally, menstrual periods begin between ages 11 to 17 in young women. A normal menstrual cycle/period may begin every 23 days up to 35 days and lasts from 1 to 7 days. Around 12 to 14 days before your menstrual period starts, ovulation (ovary produces an egg) occurs. When counting the time between menstrual periods, count from the first day of bleeding of the previous period to the first day of bleeding of the next period.  Dysfunctional (abnormal) uterine bleeding is bleeding that is different from a normal menstrual period. Your periods may come earlier or later than usual. They may be lighter, have blood clots or be heavier. You may have bleeding between periods, or you may skip one period or more. You may have bleeding after sexual intercourse, bleeding after menopause, or no menstrual period.  CAUSES   · Pregnancy (normal, miscarriage, tubal).  · IUDs (intrauterine device, birth control).  · Birth control pills.  · Hormone treatment.  · Menopause.  · Infection of the cervix.  · Blood clotting problems.  · Infection of the inside lining of the uterus.  · Endometriosis, inside lining of the uterus growing in the pelvis and other female organs.  · Adhesions (scar tissue) inside the uterus.  · Obesity or severe weight loss.  · Uterine polyps inside the uterus.  · Cancer of the vagina, cervix, or uterus.  · Ovarian cysts or polycystic ovary syndrome.  · Medical problems (diabetes, thyroid disease).  · Uterine fibroids (noncancerous tumor).  · Problems with your female hormones.  · Endometrial hyperplasia, very thick lining and enlarged cells inside of the uterus.  · Medicines that interfere with ovulation.  · Radiation to the pelvis or abdomen.  · Chemotherapy.  DIAGNOSIS   · Your doctor will discuss the history of your menstrual periods, medicines you are taking, changes in your weight, stress in your life, and any medical problems you may have.  · Your doctor will do a physical  and pelvic examination.  · Your doctor may want to perform certain tests to make a diagnosis, such as:  · Pap test.  · Blood tests.  · Cultures for infection.  · CT scan.  · Ultrasound.  · Hysteroscopy.  · Laparoscopy.  · MRI.  · Hysterosalpingography.  · D and C.  · Endometrial biopsy.  TREATMENT   Treatment will depend on the cause of the dysfunctional uterine bleeding (DUB). Treatment may include:  · Observing your menstrual periods for a couple of months.  · Prescribing medicines for medical problems, including:  · Antibiotics.  · Hormones.  · Birth control pills.  · Removing an IUD (intrauterine device, birth control).  · Surgery:  · D and C (scrape and remove tissue from inside the uterus).  · Laparoscopy (examine inside the abdomen with a lighted tube).  · Uterine ablation (destroy lining of the uterus with electrical current, laser, heat, or freezing).  · Hysteroscopy (examine cervix and uterus with a lighted tube).  · Hysterectomy (remove the uterus).  HOME CARE INSTRUCTIONS   · If medicines were prescribed, take exactly as directed. Do not change or switch medicines without consulting your caregiver.  · Long term heavy bleeding may result in iron deficiency. Your caregiver may have prescribed iron pills. They help replace the iron that your body lost from heavy bleeding. Take exactly as directed.  · Do not take aspirin or medicines that contain aspirin one week before or during your menstrual period. Aspirin may make   the bleeding worse.  · If you need to change your sanitary pad or tampon more than once every 2 hours, stay in bed with your feet elevated and a cold pack on your lower abdomen. Rest as much as possible, until the bleeding stops or slows down.  · Eat well-balanced meals. Eat foods high in iron. Examples are:  · Leafy green vegetables.  · Whole-grain breads and cereals.  · Eggs.  · Meat.  · Liver.  · Do not try to lose weight until the abnormal bleeding has stopped and your blood iron level is  back to normal. Do not lift more than ten pounds or do strenuous activities when you are bleeding.  · For a couple of months, make note on your calendar, marking the start and ending of your period, and the type of bleeding (light, medium, heavy, spotting, clots or missed periods). This is for your caregiver to better evaluate your problem.  SEEK MEDICAL CARE IF:   · You develop nausea (feeling sick to your stomach) and vomiting, dizziness, or diarrhea while you are taking your medicine.  · You are getting lightheaded or weak.  · You have any problems that may be related to the medicine you are taking.  · You develop pain with your DUB.  · You want to remove your IUD.  · You want to stop or change your birth control pills or hormones.  · You have any type of abnormal bleeding mentioned above.  · You are over 16 years old and have not had a menstrual period yet.  · You are 21 years old and you are still having menstrual periods.  · You have any of the symptoms mentioned above.  · You develop a rash.  SEEK IMMEDIATE MEDICAL CARE IF:   · An oral temperature above 102° F (38.9° C) develops.  · You develop chills.  · You are changing your sanitary pad or tampon more than once an hour.  · You develop abdominal pain.  · You pass out or faint.  Document Released: 01/19/2000 Document Revised: 04/15/2011 Document Reviewed: 12/20/2008  [employer]® Patient Information ©2013 [employer].

## 2011-12-26 NOTE — Progress Notes (Signed)
[place] discussed wanting to gain weight and possibly switching birth control. Other methods disussed and written information offered. Discussed using nutritional supplements such as ensure or carnation instant breakfast or equivalent as supplements.  Encouraged patient to make appointment to discuss switching birth control with provider.

## 2011-12-26 NOTE — Progress Notes (Signed)
History:  21 y.o. G2P1011 here today for schedule Depo Provera injection, but complains of malodorous vaginal discharge for a few days. Also had some spotting.  No other concerns.  The following portions of the patient's history were reviewed and updated as appropriate: allergies, current medications, past family history, past medical history, past social history, past surgical history and problem list.  Review of Systems:  Pertinent items are noted in HPI.  Objective:  Physical Exam Blood pressure 102/50, pulse 71, temperature 98.7 F (37.1 C), temperature source Oral, resp. rate 20, height 5' 0.5" (1.537 m), weight 110 lb 9.6 oz (50.168 kg), unknown if currently breastfeeding. Gen: NAD Abd: Soft, nontender and nondistended Pelvic: Normal appearing external genitalia; normal appearing vaginal mucosa and cervix.  Small amount of yellow discharge, sample obtained.  Small uterus, no other palpable masses, diffuse uterine and adnexal tenderness; patient says all pelvic examinations are uncomfortable.  Assessment & Plan:   Will follow up results of sample analysis and manage accordingly. Continue Depo Provera as scheduled

## 2011-12-27 ENCOUNTER — Telehealth: Payer: Self-pay | Admitting: *Deleted

## 2011-12-27 NOTE — Telephone Encounter (Signed)
Pt left message stating that she "wants to know what these papers are for that you gave me !!  Is something wrong with me that you didn't tell me? That's what I came there for!!  I need a call back from the nurse ASAP !!"  Pt sounded angry, was yelling and used profanity @ the end of her message.  I called pt this morning and left message that I was calling to discuss her visit yesterday and the papers that she was given. I will be here until 1200 today and will return to the office @ 0730 on Monday 11/25. I stated that I was sorry she became upset after her visit yesterday, that there is nothing for her to worry about and once we can talk, I believe she will feel better.

## 2011-12-30 NOTE — Telephone Encounter (Addendum)
Called and spoke w/pt.  I stated that I was calling to answer her questions. She stated that she no longer had any questions.  I remarked that she sounded so upset last week on her message and I wanted to be sure she understood that there is nothing wrong with her. The information she was given was meant to further explain the irregular bleeding she has been experiencing and that it is normal for pts who are taking Depo Provera injections. Pt voiced understanding. Pt inquired about her test results from last week. After calling cytology, I was able to tell her that the GC, Chlamydia and trich tests were negative. The tests for yeast and BV will be ready tomorrow. I told pt that I will call her with those results. Pt thanked me, voiced understanding and stated that I may leave a message if she does not answer the phone. 11/26  1335 I called pt and left message that the clinic is closing for the day and her results have not been received @ this time. I will call her tomorrow with the results.

## 2012-01-01 NOTE — Telephone Encounter (Signed)
Called pt and left message that I have talked with the lab about her remaining test results. I have been informed that the results will not be complete until after 5pm. The clinic will be closed at that time. I have asked for the results to be called to the doctor on call. If there is an abnormality which requires immediate treatment, she will receive a phone call. Please be aware that the remaining tests being performed are for vaginal yeast and bacterial vaginosis. These conditions are not sexually transmitted and do not pose an urgent health threat even when present. I will follow up with her on Monday 12/2.

## 2012-01-02 ENCOUNTER — Encounter: Payer: Self-pay | Admitting: Obstetrics and Gynecology

## 2012-01-03 MED ORDER — FLUCONAZOLE 150 MG PO TABS
150.0000 mg | ORAL_TABLET | Freq: Once | ORAL | Status: DC
Start: 2012-01-03 — End: 2012-03-22

## 2012-01-03 MED ORDER — TINIDAZOLE 500 MG PO TABS: 2.0000 g | ORAL_TABLET | Freq: Every day | ORAL | Status: DC

## 2012-01-03 NOTE — Addendum Note (Signed)
Addended by: Jaynie Collins A on: 01/03/2012 10:03 AM   Modules accepted: Orders

## 2012-01-03 NOTE — Progress Notes (Signed)
Testing revealed bacterial vaginosis and candidiasis. Negative GC, Chlam and Trich. Tinidazole and Diflucan prescribed as phone in medications since there was no pharmacy on file. Attempted to call patient, there was no answer and it went to a nonspecific voicemail so no message was left.  Patient will be called on 12/2 about her diagnoses and management once she provides a pharmacy to phone in the medications.

## 2012-01-03 NOTE — Progress Notes (Signed)
Patient ID: Sara Arroyo, female   DOB: 08-17-1990, 21 y.o.   MRN: [id number] Attending on call during evening on 11/27 received phone call from pathology with results consistent with BV. Results not yet reported on EPIC on 1/28 and unable to verify previous verbal report. Patient will be contacted on Monday with results and treated appropriately if indicated.

## 2012-01-06 NOTE — Telephone Encounter (Signed)
Results received by Dr. Macon Large on 11/29 and were reviewed.  Pt was positive for candida and BV. She attempted phone call to pt on 11/29 but reached non-specific voicemail and message was not left. Rx orders placed as phone-in since no pharmacy listed for pt. I called pt and left message that I was calling with results. I asked pt to leave message on nurse voicemail as to when she can be reached or if she would like these results to be left on her voice mail. We will still need pt's pharmacy information.

## 2012-01-08 NOTE — Telephone Encounter (Signed)
Called patient back and informed her we had her results back from the tests she had done the other day and they came back positive for a yeast infection and BV and I would like to know what pharmacy she would like for [place] to call in the medications to treat that. Patient stated the [facility] drug on [address]. Asked patient if she understood what yeast and BV was and the patient responded no. I told her that they were not sexually transmitted and were very common for any woman to get both of those due to a bacteria overgrowth and that they both can cause an uncomfortable discolored discharge and that you can also have odor and itching associated with it as well. Told patient it was treated with two different medicines for each and reviewed those with the patient. Patient verbalized understanding and had no further questions.

## 2012-03-12 ENCOUNTER — Ambulatory Visit: Payer: Medicaid Other

## 2012-03-16 ENCOUNTER — Ambulatory Visit: Payer: Medicaid Other

## 2012-03-22 ENCOUNTER — Emergency Department (HOSPITAL_COMMUNITY)
Admission: EM | Admit: 2012-03-22 | Discharge: 2012-03-23 | Disposition: A | Discharge status: Home / Self Care | Payer: Medicaid Other | Attending: Emergency Medicine | Admitting: Emergency Medicine

## 2012-03-22 ENCOUNTER — Encounter (HOSPITAL_COMMUNITY): Payer: Self-pay | Admitting: Nurse Practitioner

## 2012-03-22 DIAGNOSIS — R05 Cough: Secondary | ICD-10-CM | POA: Insufficient documentation

## 2012-03-22 DIAGNOSIS — Z79899 Other long term (current) drug therapy: Secondary | ICD-10-CM | POA: Insufficient documentation

## 2012-03-22 DIAGNOSIS — R509 Fever, unspecified: Secondary | ICD-10-CM | POA: Insufficient documentation

## 2012-03-22 DIAGNOSIS — R059 Cough, unspecified: Secondary | ICD-10-CM | POA: Insufficient documentation

## 2012-03-22 DIAGNOSIS — R0982 Postnasal drip: Secondary | ICD-10-CM | POA: Insufficient documentation

## 2012-03-22 DIAGNOSIS — J0100 Acute maxillary sinusitis, unspecified: Secondary | ICD-10-CM | POA: Insufficient documentation

## 2012-03-22 DIAGNOSIS — J3489 Other specified disorders of nose and nasal sinuses: Secondary | ICD-10-CM | POA: Insufficient documentation

## 2012-03-22 DIAGNOSIS — H9209 Otalgia, unspecified ear: Secondary | ICD-10-CM | POA: Insufficient documentation

## 2012-03-22 MED ORDER — AMOXICILLIN 500 MG PO CAPS: 500.0000 mg | ORAL_CAPSULE | Freq: Three times a day (TID) | ORAL | Status: DC

## 2012-03-22 MED ORDER — BENZONATATE 200 MG PO CAPS: 200.0000 mg | ORAL_CAPSULE | Freq: Three times a day (TID) | ORAL | Status: DC | PRN

## 2012-03-22 MED ORDER — PSEUDOEPHEDRINE HCL 60 MG PO TABS: 60.0000 mg | ORAL_TABLET | Freq: Four times a day (QID) | ORAL | Status: DC | PRN

## 2012-03-22 NOTE — ED Notes (Signed)
Pt c/o nasal congestion, cough, nausea, earache, body aches, sweats for past week. Pt reports she vomited after coughing a few times.

## 2012-03-22 NOTE — ED Provider Notes (Signed)
Medical screening examination/treatment/procedure(s) were performed by non-physician practitioner and as supervising physician I was immediately available for consultation/collaboration.   Daion Ginsberg, MD 03/22/12 2327 

## 2012-03-22 NOTE — ED Provider Notes (Signed)
History     CSN: [id number]  Arrival date & time 03/22/12  1325   First MD Initiated Contact with Patient 03/22/12 1400      Chief Complaint  Patient presents with  . URI    (Consider location/radiation/quality/duration/timing/severity/associated sxs/prior treatment) HPI Comments: Sara Arroyo is a 22 y.o. Female presenting with a 2 week history of uri symptoms.  She reports having what she describes as a "a simple cold" including nonproductive cough, nasal congestion with clear rhinorrhea which has been persistent and now included pressure in her right cheek and right ear along with purulent and blood tinged rhinorrhea and post nasal drip.  She has had fever and chills.  She denies headaches, dizziness, tinnitus and has had no neck pain or stiffness. She has taken tylenol and advil without relief of her symptoms.  She denies shortness of breath and chest pain.     The history is provided by the patient.    Past Medical History  Diagnosis Date  . Abnormal Pap smear     Past Surgical History  Procedure Laterality Date  . Colposcopy  10/2011    Family History  Problem Relation Age of Onset  . Cancer Father     History  Substance Use Topics  . Smoking status: Never Smoker   . Smokeless tobacco: Never Used  . Alcohol Use: No    OB History   Grav Para Term Preterm Abortions TAB SAB Ect Mult Living   2 1 1  1 1    1       Review of Systems  Constitutional: Positive for fever and chills.  HENT: Positive for ear pain, postnasal drip and sinus pressure. Negative for congestion, sore throat, facial swelling, neck pain, neck stiffness and dental problem.   Eyes: Negative.   Respiratory: Positive for cough. Negative for chest tightness, shortness of breath and wheezing.   Cardiovascular: Negative for chest pain.  Gastrointestinal: Negative for nausea and abdominal pain.  Genitourinary: Negative.   Musculoskeletal: Negative for joint swelling and arthralgias.  Skin:  Negative.  Negative for rash and wound.  Neurological: Negative for dizziness, weakness, light-headedness, numbness and headaches.  Psychiatric/Behavioral: Negative.     Allergies  Review of patient's allergies indicates no known allergies.  Home Medications   Current Outpatient Rx  Name  Route  Sig  Dispense  Refill  . Pseudoephedrine-Ibuprofen (ADVIL COLD/SINUS PO)   Oral   Take 1 tablet by mouth every 4 (four) hours as needed (congestion).         Marland Kitchen amoxicillin (AMOXIL) 500 MG capsule   Oral   Take 1 capsule (500 mg total) by mouth 3 (three) times daily.   30 capsule   0   . benzonatate (TESSALON) 200 MG capsule   Oral   Take 1 capsule (200 mg total) by mouth 3 (three) times daily as needed for cough.   20 capsule   0   . medroxyPROGESTERone (DEPO-PROVERA) 150 MG/ML injection   Intramuscular   Inject 1 mL (150 mg total) into the muscle once.   1 mL   4   . pseudoephedrine (SUDAFED) 60 MG tablet   Oral   Take 1 tablet (60 mg total) by mouth every 6 (six) hours as needed for congestion.   30 tablet   0     BP 107/71  Pulse 90  Temp(Src) 99.1 F (37.3 C) (Oral)  Resp 16  SpO2 99%  Physical Exam  Constitutional: She is oriented to person,  place, and time. She appears well-developed and well-nourished.  HENT:  Head: Normocephalic and atraumatic.  Right Ear: Tympanic membrane and ear canal normal.  Left Ear: Tympanic membrane and ear canal normal.  Nose: Mucosal edema and rhinorrhea present. Right sinus exhibits maxillary sinus tenderness.  Mouth/Throat: Uvula is midline, oropharynx is clear and moist and mucous membranes are normal. No oropharyngeal exudate, posterior oropharyngeal edema, posterior oropharyngeal erythema or tonsillar abscesses.  Eyes: Conjunctivae are normal.  Cardiovascular: Normal rate and normal heart sounds.   Pulmonary/Chest: Effort normal. No respiratory distress. She has no wheezes. She has no rales.  Abdominal: Soft. There is no  tenderness.  Musculoskeletal: Normal range of motion.  Neurological: She is alert and oriented to person, place, and time.  Skin: Skin is warm and dry. No rash noted.  Psychiatric: She has a normal mood and affect.    ED Course  Procedures (including critical care time)  Labs Reviewed - No data to display No results found.   1. Sinusitis, acute maxillary       MDM  Pt prescribed amoxil,  Tessalon and sudafed.  Encouraged menthol cough lozenges,  Moist compresses to face,  Steam therapy.  Motrin for fever,  Recheck with pcp or return here for worsened sx.        Burgess Amor, [place] 03/22/12 [phone]

## 2012-03-23 ENCOUNTER — Ambulatory Visit: Payer: Medicaid Other

## 2012-03-24 ENCOUNTER — Ambulatory Visit (INDEPENDENT_AMBULATORY_CARE_PROVIDER_SITE_OTHER): Payer: Medicaid Other

## 2012-03-24 VITALS — BP 124/78 | HR 90 | Wt 107.4 lb

## 2012-03-24 DIAGNOSIS — Z3049 Encounter for surveillance of other contraceptives: Secondary | ICD-10-CM

## 2012-03-24 MED ORDER — MEDROXYPROGESTERONE ACETATE 150 MG/ML IM SUSP
150.0000 mg | Freq: Once | INTRAMUSCULAR | Status: AC
  Administered 2012-03-24: 150 mg via INTRAMUSCULAR

## 2012-06-09 ENCOUNTER — Ambulatory Visit: Payer: Medicaid Other

## 2012-06-15 ENCOUNTER — Ambulatory Visit: Payer: Medicaid Other

## 2012-06-30 ENCOUNTER — Ambulatory Visit: Payer: Medicaid Other

## 2012-07-08 ENCOUNTER — Ambulatory Visit: Payer: Medicaid Other

## 2012-07-14 ENCOUNTER — Ambulatory Visit (INDEPENDENT_AMBULATORY_CARE_PROVIDER_SITE_OTHER): Payer: Medicaid Other | Admitting: *Deleted

## 2012-07-14 VITALS — BP 118/64 | HR 81 | Wt 102.8 lb

## 2012-07-14 DIAGNOSIS — Z3049 Encounter for surveillance of other contraceptives: Secondary | ICD-10-CM

## 2012-07-15 NOTE — Progress Notes (Signed)
Pt here for Depo Provera injection which was due 5/6-5/20.  She has had unprotected sex during the past 2 weeks. I explained to pt that she cannot have her injection today because of the risk that she may have become pregnant during that time. She will need to abstain from intercourse for the next 2 weeks. Then return to the clinic and if her urine pregnancy test is negative, she will be able to have the injection on that day. Pt was upset but agreed and voiced understanding.

## 2012-07-28 ENCOUNTER — Ambulatory Visit: Payer: Medicaid Other

## 2012-08-06 ENCOUNTER — Ambulatory Visit: Payer: Medicaid Other

## 2012-09-03 ENCOUNTER — Encounter: Payer: Self-pay | Admitting: Obstetrics & Gynecology

## 2012-09-03 ENCOUNTER — Other Ambulatory Visit (HOSPITAL_COMMUNITY)
Admission: RE | Admit: 2012-09-03 | Discharge: 2012-09-03 | Disposition: A | Discharge status: Home / Self Care | Payer: Medicaid Other | Source: Ambulatory Visit | Attending: Obstetrics & Gynecology | Admitting: Obstetrics & Gynecology

## 2012-09-03 ENCOUNTER — Ambulatory Visit (INDEPENDENT_AMBULATORY_CARE_PROVIDER_SITE_OTHER): Payer: Medicaid Other | Admitting: Obstetrics & Gynecology

## 2012-09-03 VITALS — BP 114/70 | HR 79 | Temp 98.0°F | Ht 60.0 in | Wt 108.4 lb

## 2012-09-03 DIAGNOSIS — Z3202 Encounter for pregnancy test, result negative: Secondary | ICD-10-CM

## 2012-09-03 DIAGNOSIS — Z113 Encounter for screening for infections with a predominantly sexual mode of transmission: Secondary | ICD-10-CM | POA: Insufficient documentation

## 2012-09-03 DIAGNOSIS — Z3049 Encounter for surveillance of other contraceptives: Secondary | ICD-10-CM

## 2012-09-03 DIAGNOSIS — N949 Unspecified condition associated with female genital organs and menstrual cycle: Secondary | ICD-10-CM

## 2012-09-03 DIAGNOSIS — N938 Other specified abnormal uterine and vaginal bleeding: Secondary | ICD-10-CM

## 2012-09-03 DIAGNOSIS — Z01419 Encounter for gynecological examination (general) (routine) without abnormal findings: Secondary | ICD-10-CM | POA: Insufficient documentation

## 2012-09-03 DIAGNOSIS — Z3200 Encounter for pregnancy test, result unknown: Secondary | ICD-10-CM

## 2012-09-03 DIAGNOSIS — Z3042 Encounter for surveillance of injectable contraceptive: Secondary | ICD-10-CM | POA: Insufficient documentation

## 2012-09-03 MED ORDER — MEDROXYPROGESTERONE ACETATE 150 MG/ML IM SUSP
150.0000 mg | Freq: Once | INTRAMUSCULAR | Status: AC
  Administered 2012-09-03: 150 mg via INTRAMUSCULAR

## 2012-09-03 MED ORDER — MEDROXYPROGESTERONE ACETATE 150 MG/ML IM SUSP: 150.0000 mg | Freq: Once | INTRAMUSCULAR | Status: DC

## 2012-09-03 NOTE — Patient Instructions (Signed)

## 2012-09-03 NOTE — Progress Notes (Signed)
Patient ID: Sara Arroyo, female   DOB: 1990-11-09, 22 y.o.   MRN: [id number]  Chief Complaint  Patient presents with  . Gynecologic Exam    last Depo Provera given 03/24/12 and late.  Irregular bleeding some spotting  . Abdominal Pain  . Back Pain  . Contraception    wants to restart Depo Provera.  Currently sexually active with no protection    HPI Sara Arroyo is a 21 y.o. female.  [id number] No LMP recorded. Irregular bleeding and cramps since last depo provera 03/2012, wants to restart DMPA.  Fel  HPI  Past Medical History  Diagnosis Date  . Abnormal Pap smear     Past Surgical History  Procedure Laterality Date  . Colposcopy  10/2011    Family History  Problem Relation Age of Onset  . Cancer Father     Social History History  Substance Use Topics  . Smoking status: Never Smoker   . Smokeless tobacco: Never Used  . Alcohol Use: No    No Known Allergies  Current Outpatient Prescriptions  Medication Sig Dispense Refill  . ibuprofen (ADVIL) 200 MG tablet Take 200 mg by mouth every 6 (six) hours as needed for pain.      Marland Kitchen amoxicillin (AMOXIL) 500 MG capsule Take 1 capsule (500 mg total) by mouth 3 (three) times daily.  30 capsule  0  . benzonatate (TESSALON) 200 MG capsule Take 1 capsule (200 mg total) by mouth 3 (three) times daily as needed for cough.  20 capsule  0  . medroxyPROGESTERone (DEPO-PROVERA) 150 MG/ML injection Inject 1 mL (150 mg total) into the muscle once.  1 mL  4  . pseudoephedrine (SUDAFED) 60 MG tablet Take 1 tablet (60 mg total) by mouth every 6 (six) hours as needed for congestion.  30 tablet  0  . Pseudoephedrine-Ibuprofen (ADVIL COLD/SINUS PO) Take 1 tablet by mouth every 4 (four) hours as needed (congestion).       No current facility-administered medications for this visit.    Review of Systems Review of Systems  Gastrointestinal: Negative for abdominal distention.  Genitourinary: Positive for vaginal bleeding and menstrual  problem. Negative for dysuria, pelvic pain and dyspareunia.       Tender right lateral breast lump    Blood pressure 114/70, pulse 79, temperature 98 F (36.7 C), temperature source Oral, height 5' (1.524 m), weight 108 lb 6.4 oz (49.17 kg).  Physical Exam Physical Exam  Constitutional: She is oriented to person, place, and time. She appears well-developed. No distress.  Neck: Normal range of motion. No thyromegaly present.  Pulmonary/Chest: Effort normal. No respiratory distress.  Breasts with fibrocystic changes, mobile tender firm 1 cm mass lateral edge r breast  Musculoskeletal: Normal range of motion.  Neurological: She is alert and oriented to person, place, and time.  Skin: Skin is warm and dry.  Psychiatric: She has a normal mood and affect. Her behavior is normal.    Data Reviewed Pa and bx result  Assessment    LSIL bx Breast lump suspect LN     Plan    Pap and cotesting and CT GC done  Breast check  4 weeks DMPA 150 mg IM Q3 mo        Hitoshi Werts 09/03/2012, 2:19 PM

## 2012-10-01 ENCOUNTER — Ambulatory Visit: Payer: Medicaid Other | Admitting: Obstetrics & Gynecology

## 2012-10-13 ENCOUNTER — Telehealth: Payer: Self-pay | Admitting: *Deleted

## 2012-10-13 NOTE — Telephone Encounter (Signed)
Pt left message stating that she has concerns. She had vag d/c at time of her Pap and is still having the d/c.  Please call back.

## 2012-10-14 NOTE — Telephone Encounter (Signed)
Called patient, no answer- left message that we are returning your phone call, please call us back at the clinics 

## 2012-10-14 NOTE — Telephone Encounter (Signed)
Result Note    LSIL, repeat cytology 12 months   Dr Debroah Loop

## 2012-10-19 NOTE — Telephone Encounter (Signed)
Called patient, no answer- left message stating we are trying to get in touch with you, please call us back at the clinics

## 2012-10-20 NOTE — Telephone Encounter (Signed)
Called pt and left message that I am calling to discuss her concern and to provide her with test result. Please call back and let us know if we may leave detailed information and instructions on your voice mail.

## 2012-10-21 MED ORDER — FLUCONAZOLE 150 MG PO TABS: 150.0000 mg | ORAL_TABLET | Freq: Once | ORAL | Status: DC

## 2012-10-21 MED ORDER — METRONIDAZOLE 500 MG PO TABS: 500.0000 mg | ORAL_TABLET | Freq: Two times a day (BID) | ORAL | Status: DC

## 2012-10-21 NOTE — Telephone Encounter (Signed)
Called pt and informed pt of pap results and to follow up with another pap in 12 months and pt stated that she is having a white discharge with a fishy odor.  I advised pt that I would e-prescribe flagyl po bid for seven days and order diflucan in case she gets a yeast infections.  I explained to pt that if her symptoms do not subside after taking medication to please call and schedule an appt for discharge evaluation.  Pt stated understanding

## 2012-10-23 ENCOUNTER — Telehealth: Payer: Self-pay | Admitting: Obstetrics and Gynecology

## 2012-10-23 NOTE — Telephone Encounter (Signed)
Patient called with questions about the medication she is taking.   1208- called patient back; no answer. Left message to RTC.

## 2012-11-05 NOTE — Telephone Encounter (Signed)
Called patient, no answer- left message stating we are returning her call for a couple weeks ago and sorry for the delay in returning her call, please call us back at the clinics if you still have questions about a medication you're taking

## 2012-11-20 ENCOUNTER — Ambulatory Visit (INDEPENDENT_AMBULATORY_CARE_PROVIDER_SITE_OTHER): Payer: Medicaid Other | Admitting: Obstetrics and Gynecology

## 2012-11-20 VITALS — BP 115/67 | HR 88 | Wt 112.0 lb

## 2012-11-20 DIAGNOSIS — Z3049 Encounter for surveillance of other contraceptives: Secondary | ICD-10-CM

## 2012-11-20 MED ORDER — MEDROXYPROGESTERONE ACETATE 104 MG/0.65ML ~~LOC~~ SUSP
104.0000 mg | Freq: Once | SUBCUTANEOUS | Status: AC
  Administered 2012-11-20: 104 mg via SUBCUTANEOUS

## 2013-02-12 ENCOUNTER — Ambulatory Visit: Payer: Medicaid Other

## 2013-02-18 ENCOUNTER — Ambulatory Visit (INDEPENDENT_AMBULATORY_CARE_PROVIDER_SITE_OTHER): Payer: Medicaid Other | Admitting: *Deleted

## 2013-02-18 VITALS — BP 128/76 | HR 113 | Wt 115.4 lb

## 2013-02-18 DIAGNOSIS — N949 Unspecified condition associated with female genital organs and menstrual cycle: Secondary | ICD-10-CM

## 2013-02-18 DIAGNOSIS — N938 Other specified abnormal uterine and vaginal bleeding: Secondary | ICD-10-CM

## 2013-02-18 DIAGNOSIS — N925 Other specified irregular menstruation: Secondary | ICD-10-CM

## 2013-02-18 DIAGNOSIS — Z3049 Encounter for surveillance of other contraceptives: Secondary | ICD-10-CM

## 2013-02-18 MED ORDER — MEDROXYPROGESTERONE ACETATE 104 MG/0.65ML ~~LOC~~ SUSP
104.0000 mg | Freq: Once | SUBCUTANEOUS | Status: AC
  Administered 2013-02-18: 104 mg via SUBCUTANEOUS

## 2013-02-22 ENCOUNTER — Telehealth: Payer: Self-pay | Admitting: General Practice

## 2013-02-22 NOTE — Telephone Encounter (Signed)
Patient called and left a message stating she needs a nurse to call her back ASAP because she is having problems with the depo provera shot we gave her.

## 2013-02-22 NOTE — Telephone Encounter (Signed)
Called patient, no answer- left message that we are trying to return her phone call, please call us back at the clinics

## 2013-02-23 ENCOUNTER — Other Ambulatory Visit: Payer: Self-pay | Admitting: Obstetrics and Gynecology

## 2013-02-23 ENCOUNTER — Ambulatory Visit (INDEPENDENT_AMBULATORY_CARE_PROVIDER_SITE_OTHER): Payer: Medicaid Other | Admitting: *Deleted

## 2013-02-23 VITALS — BP 122/68 | HR 83 | Temp 98.9°F | Resp 20

## 2013-02-23 DIAGNOSIS — R109 Unspecified abdominal pain: Secondary | ICD-10-CM

## 2013-02-23 MED ORDER — NORGESTIMATE-ETH ESTRADIOL 0.25-35 MG-MCG PO TABS: 1.0000 | ORAL_TABLET | Freq: Every day | ORAL | Status: DC

## 2013-02-23 NOTE — Telephone Encounter (Signed)
Pt. Called stating she was just at her [facility] and they had notified her about a prescription sent from us that was still waiting for her. Pt. States " I told you all I am not going to take that. I am going to stick with the depo injections I have been getting. I do not want it. I did not ask for it. I do not need it. I am not getting it. I am going to continue with the shots I have been getting. I want a nurse to call me ASAP." After chart review it appears Sprintec was sent in for patient.  Called pt. And informed her she can absolutely continue taking the depo provera if she would like to and we can keep her appointment; pt. States she has an appointment 05/27/13. It appears her injection appointment was canceled but I will inform front desk that she would like to keep this date. Informed pt. That she has a right to also refuse to take any medication that is sent in for her; pt. Verbalizes understanding and gratitude and states she will come in for her next injection in April. Will forward info to front desk to get patient back on schedule and have them call and confirm appointment with patient.

## 2013-02-23 NOTE — Telephone Encounter (Addendum)
Called patient, stating I am returning your phone call again from yesterday where she had a question about the depo shot. Patient became very agitated and stated that since getting the depo shot on Thursday the injection site has continued to bleed, there is a knot there, and she is cramping really bad and her back is hurting and she cannot eat or sleep or lay down and she has a witness to prove it, the father of her baby and that she has never had problems with the depo shot before and she has not got her period in 2-3 years but started bleeding vaginally the day she left here and she knows her body and this is not normal. Told patient that the depo shot she received on Thursday was the same dose as the injection she got in October but that this one was given in her stomach as she stated and that it can be normal for a knot to form at the injection site when an injection is given in the stomach, not just with depo but with other medications as well and that her vaginal bleeding could be related to her starting a period which would cause cramping and back pain. Patient began to yell and become very angry stating this is not normal and she knows her body and we are going to do something to fix this even if she has to come down here and beat us and that we will do something about this even if she has to fight us. Patient continued to yell and use profanity stating that she demanded to talk to another nurse in the office and not me and demanded to speak to someone who could do something about her pain and problems and threatened to come to the office again to fight us. Told the patient I would transfer her to another nurse- transferred patient to Diane Day who continued conversation with patient.   Note from Diane Day RNC: Pt's call was transferred to me and I spoke to pt regarding her concerns. She was very agitated, yelling and used profanity while demanding for us to "fix her problem". I told Ms Linde GillisMaynard that I  knew she was upset and I wanted to help her. I asked for her to take a deep breath and calm down so that we could work through this and find a solution together. I stated that I had been informed that she was concerned about having bleeding from her injection site. She stated yes and not just that but "down there." I asked if she meant vaginal bleeding and she replied. "Yes I have not had a period in 2-3 years then I get this injection and start bleeding the same day! And they gave me the injection in my stomach and I have never had it there before. Something is not right and y'all are gonna see me and fix it!"  I explained that it is normal to have irregular bleeding while receiving Depo Provera even though she may not have had bleeding for awhile. She then stated (yelling), "I know that! I'm talking about this pain in my abdomen and the bleeding from the injection." I told pt that we could see her in the clinic today to evaluate her problems. She continued to use profanity and stating that she is going to beat somebody and they better take care of her. I attempted to give pt an appt time at which she stated, " I can't come until after 12 because I  have a dentist appt. I'm also going to bring my child because I have no one to keep her."  I explained that we will not be able to see her if she brings her child due to the current visitation restrictions regarding the flu. Pt again was yelling and said, "you will see me!!" in a threatening tone.  Then she asked to speak to another nurse, a Software engineer because I was not helping her. I stated that I was the supervising nurse today. I then stated that we will be glad to see her for evaluation today but we will not be able to do so if she brings her child. Pt then asked who she needs to talk to when she gets here and what does she need to do. I instructed her to register at our front office as always. Pt then hung up.

## 2013-02-23 NOTE — Progress Notes (Signed)
Pt here for visit after telephone call to clinic earlier today wherein she was reporting abdominal pain and a lump at the Depo Provera injection site from 02/18/13. I explained that with any injection there can sometimes be this type of issue either from the medication dispersing slowly or in some cases from a small amount of blood under the skin similar to a bruise. I advised that it will get better over time. I also explained how this dose of Depo Provera is different than what she previously received in that it is a different dose of medication and the injection is given in the subq tissue rather than the muscle. She states she took ibuprofen 400 mg @ 1130 today for her abdominal pain and it helped . She also states that the pain will come back again. Pt was at the dentist today and received Rx for antibiotic as well as Rx for ibuprofen 800 mg due to tooth abscess.  I advised her not to take additional OTC ibuprofen once she gets her Rx filled and is taking the 800 mg dose. She voiced understanding.  I escorted her to the exam room and paged Dr. Erin FullingHarraway-Smith as was previously arranged.  Pt was informed that there would be a short wait due to the doctor was busy with another patient. When Dr. Erin FullingHarraway-Smith arrived to see pt approx. 10 minutes later, the pt stated to her that she did not need to be seen.  She also stated that now that someone has "explained it to her", she is fine. Dr. Erin FullingHarraway-Smith offered to examine her abdomen where she is having the pain but pt declined.  Pt asked if she needs to continue to receive the Depo Provera in her abdomen. Dr. Erin FullingHarraway-Smith informed her that if she does not want it in her abdomen, she can be given a prescription for the alternate dose/preparation of Depo Provera so that she may obtain from her pharmacy and bring it to the clinic for administration.  Pt voiced understanding and stated she did not want to do that. Pt again was offered opportunity for examination of  her abdomen and declined.

## 2013-02-23 NOTE — Progress Notes (Signed)
Patient called office upset regarding vaginal bleeding s/p Depo-Provera subcutaneous injection recently. Rx OCP to be taken continuously e-prescribed. Will offer IM depo-provera at next nursing visit

## 2013-05-13 ENCOUNTER — Ambulatory Visit: Payer: Medicaid Other

## 2013-05-17 ENCOUNTER — Ambulatory Visit: Payer: Medicaid Other

## 2013-05-17 ENCOUNTER — Ambulatory Visit (INDEPENDENT_AMBULATORY_CARE_PROVIDER_SITE_OTHER): Payer: Medicaid Other | Admitting: *Deleted

## 2013-05-17 VITALS — BP 98/56 | HR 72 | Temp 99.8°F | Wt 105.2 lb

## 2013-05-17 DIAGNOSIS — IMO0001 Reserved for inherently not codable concepts without codable children: Secondary | ICD-10-CM

## 2013-05-17 DIAGNOSIS — Z3049 Encounter for surveillance of other contraceptives: Secondary | ICD-10-CM

## 2013-05-17 MED ORDER — MEDROXYPROGESTERONE ACETATE 104 MG/0.65ML ~~LOC~~ SUSP
104.0000 mg | Freq: Once | SUBCUTANEOUS | Status: AC
  Administered 2013-05-17: 104 mg via SUBCUTANEOUS

## 2013-05-19 ENCOUNTER — Ambulatory Visit: Payer: Medicaid Other

## 2013-07-01 ENCOUNTER — Encounter (HOSPITAL_COMMUNITY): Payer: Self-pay | Admitting: Emergency Medicine

## 2013-07-01 ENCOUNTER — Emergency Department (HOSPITAL_COMMUNITY)
Admission: EM | Admit: 2013-07-01 | Discharge: 2013-07-01 | Disposition: A | Discharge status: Home / Self Care | Payer: Medicaid Other | Attending: Emergency Medicine | Admitting: Emergency Medicine

## 2013-07-01 DIAGNOSIS — S298XXA Other specified injuries of thorax, initial encounter: Secondary | ICD-10-CM | POA: Diagnosis not present

## 2013-07-01 DIAGNOSIS — S46909A Unspecified injury of unspecified muscle, fascia and tendon at shoulder and upper arm level, unspecified arm, initial encounter: Secondary | ICD-10-CM | POA: Insufficient documentation

## 2013-07-01 DIAGNOSIS — S4980XA Other specified injuries of shoulder and upper arm, unspecified arm, initial encounter: Secondary | ICD-10-CM | POA: Diagnosis not present

## 2013-07-01 DIAGNOSIS — S79919A Unspecified injury of unspecified hip, initial encounter: Secondary | ICD-10-CM | POA: Insufficient documentation

## 2013-07-01 DIAGNOSIS — Y9389 Activity, other specified: Secondary | ICD-10-CM | POA: Diagnosis not present

## 2013-07-01 DIAGNOSIS — Y92410 Unspecified street and highway as the place of occurrence of the external cause: Secondary | ICD-10-CM | POA: Diagnosis not present

## 2013-07-01 DIAGNOSIS — IMO0002 Reserved for concepts with insufficient information to code with codable children: Secondary | ICD-10-CM | POA: Diagnosis present

## 2013-07-01 DIAGNOSIS — S99929A Unspecified injury of unspecified foot, initial encounter: Secondary | ICD-10-CM

## 2013-07-01 DIAGNOSIS — S79929A Unspecified injury of unspecified thigh, initial encounter: Secondary | ICD-10-CM

## 2013-07-01 DIAGNOSIS — Z79899 Other long term (current) drug therapy: Secondary | ICD-10-CM | POA: Diagnosis not present

## 2013-07-01 DIAGNOSIS — S99919A Unspecified injury of unspecified ankle, initial encounter: Secondary | ICD-10-CM | POA: Diagnosis not present

## 2013-07-01 DIAGNOSIS — S8990XA Unspecified injury of unspecified lower leg, initial encounter: Secondary | ICD-10-CM | POA: Diagnosis not present

## 2013-07-01 DIAGNOSIS — T148XXA Other injury of unspecified body region, initial encounter: Secondary | ICD-10-CM

## 2013-07-01 MED ORDER — METHOCARBAMOL 500 MG PO TABS: 1000.0000 mg | ORAL_TABLET | Freq: Four times a day (QID) | ORAL | Status: DC

## 2013-07-01 MED ORDER — NAPROXEN 500 MG PO TABS: 500.0000 mg | ORAL_TABLET | Freq: Two times a day (BID) | ORAL | Status: DC
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
## 2013-07-04 NOTE — ED Provider Notes (Signed)
Medical screening examination/treatment/procedure(s) were performed by non-physician practitioner and as supervising physician I was immediately available for consultation/collaboration.   EKG Interpretation None        Merrie Roof, MD 07/04/13 2042

## 2013-08-16 ENCOUNTER — Ambulatory Visit (INDEPENDENT_AMBULATORY_CARE_PROVIDER_SITE_OTHER): Payer: Medicaid Other | Admitting: *Deleted

## 2013-08-16 VITALS — BP 98/62 | HR 68 | Temp 97.4°F | Wt 110.8 lb

## 2013-08-16 DIAGNOSIS — Z3042 Encounter for surveillance of injectable contraceptive: Secondary | ICD-10-CM

## 2013-08-16 DIAGNOSIS — Z3049 Encounter for surveillance of other contraceptives: Secondary | ICD-10-CM

## 2013-08-16 LAB — POCT PREGNANCY, URINE: PREG TEST UR: NEGATIVE

## 2013-08-16 MED ORDER — MEDROXYPROGESTERONE ACETATE 104 MG/0.65ML ~~LOC~~ SUSP
104.0000 mg | Freq: Once | SUBCUTANEOUS | Status: AC
  Administered 2013-08-16: 104 mg via SUBCUTANEOUS

## 2013-08-16 NOTE — Progress Notes (Signed)
Pt is concerned she may be pregnant, she if feeling symptoms and wishes to be tested before Depo Provera injection.  Pregnancy test:  NEGATIVE, discussed with patient her symptoms. Pt wishes to precede with Depo Provera injection.

## 2013-11-08 ENCOUNTER — Ambulatory Visit: Payer: Medicaid Other

## 2013-11-18 ENCOUNTER — Ambulatory Visit (INDEPENDENT_AMBULATORY_CARE_PROVIDER_SITE_OTHER): Payer: Medicaid Other

## 2013-11-18 VITALS — BP 100/50 | HR 73 | Wt 112.8 lb

## 2013-11-18 DIAGNOSIS — Z3042 Encounter for surveillance of injectable contraceptive: Secondary | ICD-10-CM

## 2013-11-18 MED ORDER — MEDROXYPROGESTERONE ACETATE 104 MG/0.65ML ~~LOC~~ SUSP
104.0000 mg | Freq: Once | SUBCUTANEOUS | Status: AC
  Administered 2013-11-18: 104 mg via SUBCUTANEOUS

## 2013-11-18 NOTE — Progress Notes (Signed)
Pt. Here today for depo provera injection. 104mg  Depo provera administered into right arm subcutaneously. Patient tolerated well. No questions, concerns or problems. Will return between 02/11/14-02/26/14 for next injection.

## 2013-12-06 ENCOUNTER — Encounter (HOSPITAL_COMMUNITY): Payer: Self-pay | Admitting: Emergency Medicine

## 2014-01-17 ENCOUNTER — Inpatient Hospital Stay (HOSPITAL_COMMUNITY)
Admission: AD | Admit: 2014-01-17 | Discharge: 2014-01-17 | Disposition: A | Discharge status: Home / Self Care | Payer: Medicaid Other | Source: Ambulatory Visit | Attending: Family Medicine | Admitting: Family Medicine

## 2014-01-17 ENCOUNTER — Encounter (HOSPITAL_COMMUNITY): Payer: Self-pay | Admitting: *Deleted

## 2014-01-17 DIAGNOSIS — R0981 Nasal congestion: Secondary | ICD-10-CM | POA: Diagnosis present

## 2014-01-17 DIAGNOSIS — J0110 Acute frontal sinusitis, unspecified: Secondary | ICD-10-CM | POA: Diagnosis not present

## 2014-01-17 DIAGNOSIS — Z3202 Encounter for pregnancy test, result negative: Secondary | ICD-10-CM | POA: Insufficient documentation

## 2014-01-17 LAB — URINALYSIS, ROUTINE W REFLEX MICROSCOPIC
Bilirubin Urine: NEGATIVE
Glucose, UA: NEGATIVE mg/dL
Hgb urine dipstick: NEGATIVE
Ketones, ur: NEGATIVE mg/dL
Leukocytes, UA: NEGATIVE
Nitrite: NEGATIVE
Protein, ur: NEGATIVE mg/dL
Specific Gravity, Urine: 1.025 (ref 1.005–1.030)
Urobilinogen, UA: 0.2 mg/dL (ref 0.0–1.0)
pH: 6 (ref 5.0–8.0)

## 2014-01-17 LAB — INFLUENZA PANEL BY PCR (TYPE A & B)
H1N1 flu by pcr: NOT DETECTED
Influenza A By PCR: NEGATIVE
Influenza B By PCR: NEGATIVE

## 2014-01-17 LAB — POCT PREGNANCY, URINE: Preg Test, Ur: NEGATIVE

## 2014-01-17 MED ORDER — AMOXICILLIN 500 MG PO CAPS: 500.0000 mg | ORAL_CAPSULE | Freq: Three times a day (TID) | ORAL | Status: DC

## 2014-01-17 NOTE — MAU Provider Note (Signed)
History     CSN: [id number]  Arrival date and time: 01/17/14 1047   First Provider Initiated Contact with Patient 01/17/14 1225      Chief Complaint  Patient presents with  . Influenza   HPI   [name] [id number], non pregnant female  who presents with Flu like symptoms, N/V/headache, nasal congestion. Symptoms have been on going for 1 week- 2 weeks.  She complains of diarrhea twice today and she has vomited once. She took some tylenol today for cold and flu with minimal relief. She feels a lot of pressure in her head.   OB History    Gravida Para Term Preterm AB TAB SAB Ectopic Multiple Living   2 1 1  1 1    1       Past Medical History  Diagnosis Date  . Abnormal Pap smear     Past Surgical History  Procedure Laterality Date  . Colposcopy  10/2011    Family History  Problem Relation Age of Onset  . Cancer Father     History  Substance Use Topics  . Smoking status: Never Smoker   . Smokeless tobacco: Never Used  . Alcohol Use: No    Allergies: No Known Allergies  Prescriptions prior to admission  Medication Sig Dispense Refill Last Dose  . ibuprofen (ADVIL) 200 MG tablet Take 200 mg by mouth every 6 (six) hours as needed for pain.   Taking  . medroxyPROGESTERone (DEPO-PROVERA) 150 MG/ML injection Inject 1 mL (150 mg total) into the muscle once. 1 mL 4 Taking  . methocarbamol (ROBAXIN) 500 MG tablet Take 2 tablets (1,000 mg total) by mouth 4 (four) times daily. 20 tablet 0   . naproxen (NAPROSYN) 500 MG tablet Take 1 tablet (500 mg total) by mouth 2 (two) times daily. 20 tablet 0   . norgestimate-ethinyl estradiol (ORTHO-CYCLEN,SPRINTEC,PREVIFEM) 0.25-35 MG-MCG tablet Take 1 tablet by mouth daily. 3 Package 1 Not Taking  . pseudoephedrine (SUDAFED) 60 MG tablet Take 1 tablet (60 mg total) by mouth every 6 (six) hours as needed for congestion. 30 tablet 0 Not Taking  . Pseudoephedrine-Ibuprofen (ADVIL COLD/SINUS PO) Take 1 tablet by mouth every 4  (four) hours as needed (congestion).   Not Taking   Results for orders placed or performed during the hospital encounter of 01/17/14 (from the past 48 hour(s))  Urinalysis, Routine w reflex microscopic     Status: None   Collection Time: 01/17/14 11:40 AM  Result Value Ref Range   Color, Urine YELLOW YELLOW   APPearance CLEAR CLEAR   Specific Gravity, Urine 1.025 1.005 - 1.030   pH 6.0 5.0 - 8.0   Glucose, UA NEGATIVE NEGATIVE mg/dL   Hgb urine dipstick NEGATIVE NEGATIVE   Bilirubin Urine NEGATIVE NEGATIVE   Ketones, ur NEGATIVE NEGATIVE mg/dL   Protein, ur NEGATIVE NEGATIVE mg/dL   Urobilinogen, UA 0.2 0.0 - 1.0 mg/dL   Nitrite NEGATIVE NEGATIVE   Leukocytes, UA NEGATIVE NEGATIVE    Comment: MICROSCOPIC NOT DONE ON URINES WITH NEGATIVE PROTEIN, BLOOD, LEUKOCYTES, NITRITE, OR GLUCOSE <1000 mg/dL.  Pregnancy, urine POC     Status: None   Collection Time: 01/17/14 11:47 AM  Result Value Ref Range   Preg Test, Ur NEGATIVE NEGATIVE    Comment:        THE SENSITIVITY OF THIS METHODOLOGY IS >24 mIU/mL   Influenza panel by PCR (type A & B, H1N1)     Status: None   Collection Time: 01/17/14 12:20  PM  Result Value Ref Range   Influenza A By PCR NEGATIVE NEGATIVE   Influenza B By PCR NEGATIVE NEGATIVE   H1N1 flu by pcr NOT DETECTED NOT DETECTED    Comment:        The Xpert Flu assay (FDA approved for nasal aspirates or washes and nasopharyngeal swab specimens), is intended as an aid in the diagnosis of influenza and should not be used as a sole basis for treatment. [facility]     Review of Systems  Constitutional: Negative for fever (Felt warm and did not check temp ).  HENT: Negative for sore throat.   Respiratory: Positive for cough and wheezing.   Gastrointestinal: Positive for nausea, vomiting and abdominal pain.  Genitourinary: Negative for dysuria, urgency and frequency.   Physical Exam   Blood pressure 118/62, pulse 105, temperature 99.7 F  (37.6 C), temperature source Oral, resp. rate 16, height 5\' 1"  (1.549 m), weight 52.753 kg (116 lb 4.8 oz), SpO2 100 %.  Physical Exam  Constitutional: She is oriented to person, place, and time. She appears well-developed and well-nourished.  Non-toxic appearance. She does not have a sickly appearance. She does not appear ill. No distress.  HENT:  Head: Normocephalic.  Right Ear: Hearing normal. Tympanic membrane is not erythematous and not bulging.  Left Ear: Hearing normal. Tympanic membrane is not erythematous and not bulging.  Nose: Right sinus exhibits maxillary sinus tenderness and frontal sinus tenderness. Left sinus exhibits maxillary sinus tenderness and frontal sinus tenderness.  + bilateral posterior auricular node tenderness   Cardiovascular: Normal rate and normal heart sounds.   Respiratory: Effort normal and breath sounds normal. She has no decreased breath sounds. She has no wheezes. She has no rhonchi.  Neurological: She is alert and oriented to person, place, and time.  Skin: Skin is warm. She is not diaphoretic.  Psychiatric: Her behavior is normal.    MAU Course  Procedures  None  MDM Low grade fever Influenza swab  Pulse ox 100% on room air  Pregnancy test negative   Assessment and Plan   A: 1. Acute frontal sinusitis, recurrence not specified      P: Over the counter immodium AD RX: Amoxicillin  Over the counter cold medicine per package instructions  Zyrtec as directed on the bottle Follow up with Urgent care if symptoms are not better in 7-10 days   Iona HansenJennifer Irene Rasch, NP 01/17/2014 7:26 PM

## 2014-01-17 NOTE — MAU Note (Signed)
[facility];  C/o flu symptoms for past 2 weeks;

## 2014-01-17 NOTE — Discharge Instructions (Signed)
Sinusitis °Sinusitis is redness, soreness, and puffiness (inflammation) of the air pockets in the bones of your face (sinuses). The redness, soreness, and puffiness can cause air and mucus to get trapped in your sinuses. This can allow germs to grow and cause an infection.  °HOME CARE  °· Drink enough fluids to keep your pee (urine) clear or pale yellow. °· Use a humidifier in your home. °· Run a hot shower to create steam in the bathroom. Sit in the bathroom with the door closed. Breathe in the steam 3-4 times a day. °· Put a warm, moist washcloth on your face 3-4 times a day, or as told by your doctor. °· Use salt water sprays (saline sprays) to wet the thick fluid in your nose. This can help the sinuses drain. °· Only take medicine as told by your doctor. °GET HELP RIGHT AWAY IF:  °· Your pain gets worse. °· You have very bad headaches. °· You are sick to your stomach (nauseous). °· You throw up (vomit). °· You are very sleepy (drowsy) all the time. °· Your face is puffy (swollen). °· Your vision changes. °· You have a stiff neck. °· You have trouble breathing. °MAKE SURE YOU:  °· Understand these instructions. °· Will watch your condition. °· Will get help right away if you are not doing well or get worse. °Document Released: 07/10/2007 Document Revised: 10/16/2011 Document Reviewed: 08/27/2011 °ExitCare® Patient Information ©2015 [employer]. This information is not intended to replace advice given to you by your health care provider. Make sure you discuss any questions you have with your health care provider. ° °

## 2014-01-17 NOTE — MAU Note (Signed)
Patient presebnts with complaint of flu like symptoms, body aches, and headache X 2 weeks. Patient denies pregnancy or vaginal bleeding but states she is experiencing abdominal pain.

## 2014-02-14 ENCOUNTER — Ambulatory Visit: Payer: Medicaid Other

## 2014-02-18 ENCOUNTER — Ambulatory Visit (INDEPENDENT_AMBULATORY_CARE_PROVIDER_SITE_OTHER): Payer: Medicaid Other | Admitting: General Practice

## 2014-02-18 VITALS — BP 117/52 | HR 77 | Temp 98.5°F | Ht 60.0 in | Wt 119.8 lb

## 2014-02-18 DIAGNOSIS — Z3042 Encounter for surveillance of injectable contraceptive: Secondary | ICD-10-CM

## 2014-02-18 MED ORDER — MEDROXYPROGESTERONE ACETATE 104 MG/0.65ML ~~LOC~~ SUSP
104.0000 mg | Freq: Once | SUBCUTANEOUS | Status: AC
  Administered 2014-02-18: 104 mg via SUBCUTANEOUS

## 2014-03-09 ENCOUNTER — Telehealth: Payer: Self-pay | Admitting: *Deleted

## 2014-03-09 NOTE — Telephone Encounter (Signed)
Pt contacted the clinic to complain of breakthrough bleeding even though she is on Depo Provera.  She states she was stuck several times and is now wondering if she received the complete dose of Depo Provera.  Chart reviewed, there were 2 injections, the first injection the medication was not released from the syringe so a second injection was needed in order for administration of medication. Offered patient an appointment on 2/12 with Dr. Jolayne Pantheronstant @ 0830 to discuss issues.  Pt agrees to appointment.
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
## 2014-03-09 NOTE — Telephone Encounter (Signed)
Patient's concerns addressed under different encounter

## 2014-03-18 ENCOUNTER — Encounter: Payer: Self-pay | Admitting: Obstetrics and Gynecology

## 2014-03-18 ENCOUNTER — Ambulatory Visit (INDEPENDENT_AMBULATORY_CARE_PROVIDER_SITE_OTHER): Payer: Medicaid Other | Admitting: Obstetrics and Gynecology

## 2014-03-18 VITALS — BP 119/65 | HR 81 | Temp 98.7°F | Wt 122.0 lb

## 2014-03-18 DIAGNOSIS — N938 Other specified abnormal uterine and vaginal bleeding: Secondary | ICD-10-CM

## 2014-03-18 DIAGNOSIS — Z3042 Encounter for surveillance of injectable contraceptive: Secondary | ICD-10-CM

## 2014-03-18 LAB — POCT PREGNANCY, URINE: Preg Test, Ur: NEGATIVE

## 2014-03-18 MED ORDER — NORGESTIMATE-ETH ESTRADIOL 0.25-35 MG-MCG PO TABS: 1.0000 | ORAL_TABLET | Freq: Every day | ORAL | Status: DC

## 2014-03-18 NOTE — Progress Notes (Signed)
Patient ID: Sara MoatsBrittany K Arroyo, female   DOB: August 14, 1990, 24 y.o.   MRN: [id number] 24 yo G2P1011 presenting today for the evaluation of breakthrough vaginal bleeding while on depo-provera. Patient received her last dose at the end of January. She received 104 mg dosage. She reports intermittent vaginal bleeding at times heavy like a period other times reduced to spotting. She has never experienced breakthrough bleeding while on depo. Patient thinks that due to multiple administration of the injection in January, she may not have received the full dose. She is concerned that she may be pregnant.  A/P 24 yo with breakthrough bleeding while on depo-provera - reassurance provided - Offered OCP until her next dose of depo-provera - Will administer 150 mg depo-provera when next due - Urine pregnancy test today- negative

## 2014-04-06 ENCOUNTER — Ambulatory Visit: Payer: Medicaid Other | Admitting: Obstetrics & Gynecology

## 2014-04-08 ENCOUNTER — Other Ambulatory Visit (HOSPITAL_COMMUNITY)
Admission: RE | Admit: 2014-04-08 | Discharge: 2014-04-08 | Disposition: A | Discharge status: Home / Self Care | Payer: Medicaid Other | Source: Ambulatory Visit | Attending: Obstetrics & Gynecology | Admitting: Obstetrics & Gynecology

## 2014-04-08 ENCOUNTER — Ambulatory Visit (INDEPENDENT_AMBULATORY_CARE_PROVIDER_SITE_OTHER): Payer: Medicaid Other | Admitting: Obstetrics & Gynecology

## 2014-04-08 ENCOUNTER — Encounter: Payer: Self-pay | Admitting: Obstetrics & Gynecology

## 2014-04-08 VITALS — BP 100/54 | HR 81 | Temp 98.1°F | Ht 60.0 in | Wt 118.4 lb

## 2014-04-08 DIAGNOSIS — Z1151 Encounter for screening for human papillomavirus (HPV): Secondary | ICD-10-CM

## 2014-04-08 DIAGNOSIS — N9489 Other specified conditions associated with female genital organs and menstrual cycle: Secondary | ICD-10-CM | POA: Diagnosis not present

## 2014-04-08 DIAGNOSIS — Z113 Encounter for screening for infections with a predominantly sexual mode of transmission: Secondary | ICD-10-CM | POA: Insufficient documentation

## 2014-04-08 DIAGNOSIS — Z01419 Encounter for gynecological examination (general) (routine) without abnormal findings: Secondary | ICD-10-CM | POA: Diagnosis not present

## 2014-04-08 DIAGNOSIS — Z01411 Encounter for gynecological examination (general) (routine) with abnormal findings: Secondary | ICD-10-CM | POA: Insufficient documentation

## 2014-04-08 DIAGNOSIS — Z124 Encounter for screening for malignant neoplasm of cervix: Secondary | ICD-10-CM

## 2014-04-08 DIAGNOSIS — Z0000 Encounter for general adult medical examination without abnormal findings: Secondary | ICD-10-CM

## 2014-04-08 DIAGNOSIS — Z118 Encounter for screening for other infectious and parasitic diseases: Secondary | ICD-10-CM

## 2014-04-08 DIAGNOSIS — N898 Other specified noninflammatory disorders of vagina: Secondary | ICD-10-CM

## 2014-04-08 LAB — HEPATITIS PANEL, ACUTE
HCV AB: NEGATIVE
HEP A IGM: NONREACTIVE
HEP B C IGM: NONREACTIVE
HEP B S AG: NEGATIVE

## 2014-04-08 LAB — WET PREP, GENITAL
CLUE CELLS WET PREP: NONE SEEN
WBC WET PREP: NONE SEEN
YEAST WET PREP: NONE SEEN

## 2014-04-08 LAB — HIV ANTIBODY (ROUTINE TESTING W REFLEX): HIV 1&2 Ab, 4th Generation: NONREACTIVE

## 2014-04-08 LAB — RPR
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
## 2014-04-11 ENCOUNTER — Telehealth: Payer: Self-pay | Admitting: General Practice

## 2014-04-11 ENCOUNTER — Telehealth: Payer: Self-pay | Admitting: *Deleted

## 2014-04-11 DIAGNOSIS — A599 Trichomoniasis, unspecified: Secondary | ICD-10-CM

## 2014-04-11 LAB — CYTOLOGY - PAP

## 2014-04-11 MED ORDER — METRONIDAZOLE 500 MG PO TABS: 2000.0000 mg | ORAL_TABLET | Freq: Two times a day (BID) | ORAL | Status: DC

## 2014-04-11 NOTE — Telephone Encounter (Signed)
Contacted patient, results given, special instructions regarding sexual relations and treatment of self and partner given.  Pt verbalizes understanding and has no further questions.

## 2014-04-11 NOTE — Telephone Encounter (Signed)
-----   Message from Allie BossierMyra C Dove, MD sent at 04/11/2014  1:49 PM EST ----- She has trich and will need flagyl 2 gram po once. Her partner should be treated also. No sex til he's been treated. Thanks

## 2014-04-11 NOTE — Telephone Encounter (Signed)
Patient called and left message stating she couldn't talk just a second ago when she was on the phone because she was around some friends but has questions and would like a callback asap. Called patient and she asked how long this will take to clear her system. Told patient we recommend abstaining for 2 weeks following treatment to make sure the infection is gone. Patient verbalized understanding and asked how long she has had this. Told patient that we cannot say for certain, but the last time she was tested was 9/14 and she was negative then. Patient verbalized understanding and had no other questions

## 2014-04-12 ENCOUNTER — Telehealth: Payer: Self-pay | Admitting: *Deleted

## 2014-04-12 NOTE — Telephone Encounter (Signed)
Pt left message requesting a call back regarding the medication she was prescribed yesterday. I called her back and she stated that she has a nasty taste in her mouth and wants to know how long it will last. The taste is making it difficult for her to eat. I stated that I could not say with certainty because all pts do not have the same response. In most cases the taste she is experiencing will become less after 24-48 hrs. She may want to check with the pharmacist for additional information. Pt stated that she just wants to know if she will have it for the rest of her life. I told her that she will not.  Pt voiced understanding and stated that she will try to hang on for a few more days.

## 2014-04-13 ENCOUNTER — Telehealth: Payer: Self-pay | Admitting: General Practice

## 2014-04-13 NOTE — Telephone Encounter (Signed)
Patient called and left message yelling, stating she is calling again. She is still in pain and needs to know what is going on. She doesn't have a ride and can't take herself to the ER. It feels like her legs are paralyzed and she needs someone to call her back and tell her what is going on because this is ridiculous.

## 2014-04-13 NOTE — Telephone Encounter (Signed)
Sara Arroyo left another message that she is calling because she is in severe pain and has never been in this type of pain.  States I understand you told me what I had and I took the medicine.States her hip is is pain and her leg is numb but still in pain. States she has never felt this way. Wants a call back asap. Per chart review was given flagyl by mouth one dose for trich.  Called Sara Arroyo and left a message we are calling back .  Please call us tomorrow if you are still having problems or go to [facility]U if your symtoms are severe.

## 2014-04-14 ENCOUNTER — Inpatient Hospital Stay (HOSPITAL_COMMUNITY)
Admission: AD | Admit: 2014-04-14 | Discharge: 2014-04-14 | Disposition: A | Discharge status: Home / Self Care | Payer: Medicaid Other | Source: Ambulatory Visit | Attending: Obstetrics & Gynecology | Admitting: Obstetrics & Gynecology

## 2014-04-14 ENCOUNTER — Encounter (HOSPITAL_COMMUNITY): Payer: Self-pay | Admitting: General Practice

## 2014-04-14 DIAGNOSIS — K529 Noninfective gastroenteritis and colitis, unspecified: Secondary | ICD-10-CM | POA: Insufficient documentation

## 2014-04-14 DIAGNOSIS — J069 Acute upper respiratory infection, unspecified: Secondary | ICD-10-CM | POA: Diagnosis not present

## 2014-04-14 DIAGNOSIS — M79604 Pain in right leg: Secondary | ICD-10-CM | POA: Insufficient documentation

## 2014-04-14 DIAGNOSIS — M79606 Pain in leg, unspecified: Secondary | ICD-10-CM | POA: Diagnosis present

## 2014-04-14 LAB — URINALYSIS, ROUTINE W REFLEX MICROSCOPIC
Bilirubin Urine: NEGATIVE
Glucose, UA: NEGATIVE mg/dL
KETONES UR: 15 mg/dL — AB
LEUKOCYTES UA: NEGATIVE
Nitrite: NEGATIVE
PROTEIN: 30 mg/dL — AB
UROBILINOGEN UA: 0.2 mg/dL (ref 0.0–1.0)
pH: 6 (ref 5.0–8.0)

## 2014-04-14 LAB — URINE MICROSCOPIC-ADD ON

## 2014-04-14 LAB — POCT PREGNANCY, URINE: PREG TEST UR: NEGATIVE

## 2014-04-14 MED ORDER — PROMETHAZINE HCL 25 MG PO TABS: 12.5000 mg | ORAL_TABLET | Freq: Four times a day (QID) | ORAL | Status: DC | PRN

## 2014-04-14 MED ORDER — PSEUDOEPHEDRINE HCL 60 MG PO TABS: 60.0000 mg | ORAL_TABLET | ORAL | Status: DC | PRN

## 2014-04-14 MED ORDER — CYCLOBENZAPRINE HCL 10 MG PO TABS: 10.0000 mg | ORAL_TABLET | Freq: Three times a day (TID) | ORAL | Status: DC | PRN

## 2014-04-14 MED ORDER — SALINE SPRAY 0.65 % NA SOLN: 1.0000 | NASAL | Status: DC | PRN

## 2014-04-14 NOTE — Telephone Encounter (Addendum)
Per chart review Sara Arroyo is in MAU currently for treatment of her complaints of leg pain , chest pain with coughing, N&V .

## 2014-04-14 NOTE — Discharge Instructions (Signed)
Take Phenergan (promethazine) 1/2 to 1 tablet every 6 hours as needed for nausea. Take Flexeril (cyclobenzaprine) up to 3 times per day as needed for leg pain/muscle spasm. Take Sudafed (pseudophedrine) 60 mg every 4 hours as needed for nasal congestion. Use saline nasal spray as often as needed to keep nose clear. You may prefer to take Benadryl (diphenydramine) at nighttime instead of Sudafed because Sudafed may keep you awake.  Upper Respiratory Infection, Adult An upper respiratory infection (URI) is also sometimes known as the common cold. The upper respiratory tract includes the nose, sinuses, throat, trachea, and bronchi. Bronchi are the airways leading to the lungs. Most people improve within 1 week, but symptoms can last up to 2 weeks. A residual cough may last even longer.  CAUSES Many different viruses can infect the tissues lining the upper respiratory tract. The tissues become irritated and inflamed and often become very moist. Mucus production is also common. A cold is contagious. You can easily spread the virus to others by oral contact. This includes kissing, sharing a glass, coughing, or sneezing. Touching your mouth or nose and then touching a surface, which is then touched by another person, can also spread the virus. SYMPTOMS  Symptoms typically develop 1 to 3 days after you come in contact with a cold virus. Symptoms vary from person to person. They may include:  Runny nose.  Sneezing.  Nasal congestion.  Sinus irritation.  Sore throat.  Loss of voice (laryngitis).  Cough.  Fatigue.  Muscle aches.  Loss of appetite.  Headache.  Low-grade fever. DIAGNOSIS  You might diagnose your own cold based on familiar symptoms, since most people get a cold 2 to 3 times a year. Your caregiver can confirm this based on your exam. Most importantly, your caregiver can check that your symptoms are not due to another disease such as strep throat, sinusitis, pneumonia, asthma,  or epiglottitis. Blood tests, throat tests, and X-rays are not necessary to diagnose a common cold, but they may sometimes be helpful in excluding other more serious diseases. Your caregiver will decide if any further tests are required. RISKS AND COMPLICATIONS  You may be at risk for a more severe case of the common cold if you smoke cigarettes, have chronic heart disease (such as heart failure) or lung disease (such as asthma), or if you have a weakened immune system. The very young and very old are also at risk for more serious infections. Bacterial sinusitis, middle ear infections, and bacterial pneumonia can complicate the common cold. The common cold can worsen asthma and chronic obstructive pulmonary disease (COPD). Sometimes, these complications can require emergency medical care and may be life-threatening. PREVENTION  The best way to protect against getting a cold is to practice good hygiene. Avoid oral or hand contact with people with cold symptoms. Wash your hands often if contact occurs. There is no clear evidence that vitamin C, vitamin E, echinacea, or exercise reduces the chance of developing a cold. However, it is always recommended to get plenty of rest and practice good nutrition. TREATMENT  Treatment is directed at relieving symptoms. There is no cure. Antibiotics are not effective, because the infection is caused by a virus, not by bacteria. Treatment may include:  Increased fluid intake. Sports drinks offer valuable electrolytes, sugars, and fluids.  Breathing heated mist or steam (vaporizer or shower).  Eating chicken soup or other clear broths, and maintaining good nutrition.  Getting plenty of rest.  Using gargles or lozenges for comfort.  Controlling fevers with ibuprofen or acetaminophen as directed by your caregiver.  Increasing usage of your inhaler if you have asthma. Zinc gel and zinc lozenges, taken in the first 24 hours of the common cold, can shorten the  duration and lessen the severity of symptoms. Pain medicines may help with fever, muscle aches, and throat pain. A variety of non-prescription medicines are available to treat congestion and runny nose. Your caregiver can make recommendations and may suggest nasal or lung inhalers for other symptoms.  HOME CARE INSTRUCTIONS   Only take over-the-counter or prescription medicines for pain, discomfort, or fever as directed by your caregiver.  Use a warm mist humidifier or inhale steam from a shower to increase air moisture. This may keep secretions moist and make it easier to breathe.  Drink enough water and fluids to keep your urine clear or pale yellow.  Rest as needed.  Return to work when your temperature has returned to normal or as your caregiver advises. You may need to stay home longer to avoid infecting others. You can also use a face mask and careful hand washing to prevent spread of the virus. SEEK MEDICAL CARE IF:   After the first few days, you feel you are getting worse rather than better.  You need your caregiver's advice about medicines to control symptoms.  You develop chills, worsening shortness of breath, or brown or red sputum. These may be signs of pneumonia.  You develop yellow or brown nasal discharge or pain in the face, especially when you bend forward. These may be signs of sinusitis.  You develop a fever, swollen neck glands, pain with swallowing, or white areas in the back of your throat. These may be signs of strep throat. SEEK IMMEDIATE MEDICAL CARE IF:   You have a fever.  You develop severe or persistent headache, ear pain, sinus pain, or chest pain.  You develop wheezing, a prolonged cough, cough up blood, or have a change in your usual mucus (if you have chronic lung disease).  You develop sore muscles or a stiff neck. Document Released: 07/17/2000 Document Revised: 04/15/2011 Document Reviewed: 04/28/2013 [facility] Patient Information 2015 [place],  [place]. This information is not intended to replace advice given to you by your health care provider. Make sure you discuss any questions you have with your health care provider. Musculoskeletal Pain Musculoskeletal pain is muscle and boney aches and pains. These pains can occur in any part of the body. Your caregiver may treat you without knowing the cause of the pain. They may treat you if blood or urine tests, X-rays, and other tests were normal.  CAUSES There is often not a definite cause or reason for these pains. These pains may be caused by a type of germ (virus). The discomfort may also come from overuse. Overuse includes working out too hard when your body is not fit. Boney aches also come from weather changes. Bone is sensitive to atmospheric pressure changes. HOME CARE INSTRUCTIONS   Ask when your test results will be ready. Make sure you get your test results.  Only take over-the-counter or prescription medicines for pain, discomfort, or fever as directed by your caregiver. If you were given medications for your condition, do not drive, operate machinery or power tools, or sign legal documents for 24 hours. Do not drink alcohol. Do not take sleeping pills or other medications that may interfere with treatment.  Continue all activities unless the activities cause more pain. When the pain lessens, slowly resume  normal activities. Gradually increase the intensity and duration of the activities or exercise.  During periods of severe pain, bed rest may be helpful. Lay or sit in any position that is comfortable.  Putting ice on the injured area.  Put ice in a bag.  Place a towel between your skin and the bag.  Leave the ice on for 15 to 20 minutes, 3 to 4 times a day.  Follow up with your caregiver for continued problems and no reason can be found for the pain. If the pain becomes worse or does not go away, it may be necessary to repeat tests or do additional testing. Your caregiver may  need to look further for a possible cause. SEEK IMMEDIATE MEDICAL CARE IF:  You have pain that is getting worse and is not relieved by medications.  You develop chest pain that is associated with shortness or breath, sweating, feeling sick to your stomach (nauseous), or throw up (vomit).  Your pain becomes localized to the abdomen.  You develop any new symptoms that seem different or that concern you. MAKE SURE YOU:   Understand these instructions.  Will watch your condition.  Will get help right away if you are not doing well or get worse. Document Released: 01/21/2005 Document Revised: 04/15/2011 Document Reviewed: 09/25/2012 [facility] Patient Information 2015 [place], [place]. This information is not intended to replace advice given to you by your health care provider. Make sure you discuss any questions you have with your health care provider.

## 2014-04-14 NOTE — MAU Provider Note (Signed)
Chief Complaint: Leg Pain and Cough   First Provider Initiated Contact with Patient 04/14/14 1633      SUBJECTIVE HPI: Sara Arroyo is a 24 y.o. G2P1011 who presents to maternity admissions reporting onset of nausea and diarrhea after Flagyl dose given on Sunday.  She reports pain in her right leg/hip that is worse when she holds her leg straight or stands, improved by bending her knee or relaxing. She also reports onset of nasal congestion, h/a, and body aches/chills, and cough after she took the Flagyl medication.  Her daughter was diagnosed with a respiratory virus by her pediatrician this week.  She denies abdominal pain, chest pain, SOB, vaginal bleeding, vaginal itching/burning, urinary symptoms, or dizziness.    Past Medical History  Diagnosis Date  . Abnormal Pap smear    Past Surgical History  Procedure Laterality Date  . Colposcopy  10/2011   History   Social History  . Marital Status: Single    Spouse Name: N/A  . Number of Children: N/A  . Years of Education: N/A   Occupational History  . Not on file.   Social History Main Topics  . Smoking status: Never Smoker   . Smokeless tobacco: Never Used  . Alcohol Use: No  . Drug Use: No  . Sexual Activity: Yes    Birth Control/ Protection: Condom   Other Topics Concern  . Not on file   Social History Narrative   No current facility-administered medications on file prior to encounter.   Current Outpatient Prescriptions on File Prior to Encounter  Medication Sig Dispense Refill  . metroNIDAZOLE (FLAGYL) 500 MG tablet Take 4 tablets (2,000 mg total) by mouth 2 (two) times daily. 4 tablet 0  . norgestimate-ethinyl estradiol (ORTHO-CYCLEN,SPRINTEC,PREVIFEM) 0.25-35 MG-MCG tablet Take 1 tablet by mouth daily. (Patient not taking: Reported on 04/08/2014) 3 Package 1   No Known Allergies  ROS: Complete ROS done and negative except for left leg pain, cough, nasal congestion, nausea, and diarrhea.  OBJECTIVE Blood  pressure 106/59, pulse 126, resp. rate 18, height 5' (1.524 m), weight 51.256 kg (113 lb), SpO2 99 %. GENERAL: Well-developed, well-nourished female in no acute distress.  HEENT: Normocephalic HEART: normal rate, heart sounds, regular rhythm RESP: normal effort, lung sounds clear and equal bilaterally ABDOMEN: Soft, non-tender EXTREMITIES: Tenderness to palpation of right upper thigh, no pain with full ROM performed, negative Homan sign, bilateral extremities cool to touch without edema NEURO: Alert and oriented   LAB RESULTS Results for orders placed or performed during the hospital encounter of 04/14/14 (from the past 24 hour(s))  Urinalysis, Routine w reflex microscopic     Status: Abnormal   Collection Time: 04/14/14  4:03 PM  Result Value Ref Range   Color, Urine AMBER (A) YELLOW   APPearance CLEAR CLEAR   Specific Gravity, Urine >1.030 (H) 1.005 - 1.030   pH 6.0 5.0 - 8.0   Glucose, UA NEGATIVE NEGATIVE mg/dL   Hgb urine dipstick TRACE (A) NEGATIVE   Bilirubin Urine NEGATIVE NEGATIVE   Ketones, ur 15 (A) NEGATIVE mg/dL   Protein, ur 30 (A) NEGATIVE mg/dL   Urobilinogen, UA 0.2 0.0 - 1.0 mg/dL   Nitrite NEGATIVE NEGATIVE   Leukocytes, UA NEGATIVE NEGATIVE  Urine microscopic-add on     Status: Abnormal   Collection Time: 04/14/14  4:03 PM  Result Value Ref Range   Squamous Epithelial / LPF FEW (A) RARE   RBC / HPF 0-2 <3 RBC/hpf   Bacteria, UA RARE RARE  Crystals CA OXALATE CRYSTALS (A) NEGATIVE   Urine-Other MUCOUS PRESENT   Pregnancy, urine POC     Status: None   Collection Time: 04/14/14  4:10 PM  Result Value Ref Range   Preg Test, Ur NEGATIVE NEGATIVE    ASSESSMENT 1. URI (upper respiratory infection)   2. Noninfectious gastroenteritis, unspecified   3. Musculoskeletal pain of lower extremity, right     PLAN Discharge home Flu swab pending Reassurance provided that respiratory symptoms and leg pain are not caused by Flagyl, but GI upset may be.   Phenergan  12.5-25 mg PO Q 6 hours for nausea Sudafed 60 mg Q 4 hours PRN congestion Saline nasal spray PRN Flexeril 10 mg TID prn leg pain/muscle spasm Increase PO fluids   Follow-up Information    Follow up with [facility] [place] MATERNITY ADMISSIONS.   Why:  As needed for emergencies   Contact information:   [address] [id number] mc [place] [address] [phone]      Sharen Counter Certified Nurse-Midwife 04/14/2014  5:12 PM

## 2014-04-14 NOTE — MAU Note (Signed)
Pt reports she has been having  right leg pain and chest pain with coughing . C/O N/V as well can't keep anything down. Was treated for Trich over the weekend and hs not felt right since.

## 2014-04-15 LAB — INFLUENZA PANEL BY PCR (TYPE A & B)
H1N1FLUPCR: DETECTED — AB
INFLAPCR: POSITIVE — AB
Influenza B By PCR: NEGATIVE

## 2014-04-17 ENCOUNTER — Telehealth: Payer: Self-pay | Admitting: Advanced Practice Midwife

## 2014-04-17 NOTE — Telephone Encounter (Signed)
Called pt to notify her of positive flu swab collected in MAU on 04/14/14.  Flu results not available at time of MAU visit, or when rechecked hours after pt discharged.  Pt initially reports she is feeling improved, but after report of positive flu swab pt became very agitated and concerned that the flu may be dangerous. Also, her daughter was recently diagnosed with respiratory illness but not swabbed for flu. She has 2 other family members in her household who have been recently diagnosed with flu, and one is on Tamiflu. Offered to send Tamiflu to pharmacy but pt declined.  Pt wants to know what else she can do.  Notified pt that if she is doing well, she has nothing to be concerned about, but again, offered Tamiflu if desired.  Pt asks if she should throw away prescriptions for Sudafed and saline nasal spray since "they are no good since I have the flu".  Indicated to pt that these medicine treat symptoms and she can continue to take these as needed.  Pt asks if she should come back to the hospital.  Again, teaching done to indicate that if she feels better, she does not need further evaluation, but that if symptoms worsen or persist, she should be seen by primary care provider.  Pt reports she has no other doctor besides [facility].  Notified pt that we would see her in MAU if she feels she needs to be evaluated but recommend primary care/Urgent Care for respiratory symptoms.  Pt hung up phone during the end of the conversation.

## 2014-04-17 NOTE — Telephone Encounter (Signed)
Phone call to pt to allow pt to ask questions, and address her concerns.  Pt reports her frustration with being sick, now x 2 wks, despite her extreme efforts to avoid illness. Pt describes herself as never sick, and she reports taking multiple precautions to avoid illness, she's "OCD" with wipes, cleaning,etc.  Pt thought that Tamiflu meds were to eliminate the virus; I explained to her that Tamiflu reduces the risk of severe complications, but that her body would be in charge of neutralizing the virus. Pt accepts this explanation.  Pt made aware that since sx have been ongoing x 2 wk, that using Tamiflu now, or even Friday, likely not helpful, as pt is past 48 hr window to benefit from Antiviral tx.  Pt having low grade temp to 100.4, and whitish cough/ sputum. Denies respiratory difficulty, SOB, dyspnea. Pt is able to retrieve her Flexeril, phenergan, and sudafed from the trash , where she had thrown it. Pt encouraged to continue symptomatic tx with these meds, continue respiratory isolation, and followup prn worsening sx. But to expect 3-10 days til recovery from the "flu".  Questions answered to pt apparent satisfaction.

## 2014-04-17 NOTE — Telephone Encounter (Signed)
Pt called back with continued concerns about her flu results.  She reports that the provider should not have called her on a Sunday but should have waited until Monday.  She reports she was told if she left MAU and received no phone call that her results were negative but now she is getting a call about positive results. She wants to speak to someone else today.  Consulted Dr Emelda FearFerguson who agrees to call pt back soon, in about an hour, when he is available.  Pt upset, wants to speak with someone now, and hung up the phone.

## 2014-04-18 ENCOUNTER — Telehealth: Payer: Self-pay | Admitting: *Deleted

## 2014-04-18 NOTE — Telephone Encounter (Addendum)
Per chart review of results had pap with [facility].  Need plan for follow up. Pap done with Dr.Dove. Will route to Dr. Marice Potterove for plan of care.

## 2014-04-19 NOTE — Telephone Encounter (Signed)
Per Dr Marice Potterove, patient needs colpo

## 2014-04-20 NOTE — Telephone Encounter (Addendum)
Will ask registars for appointment, then call patient. Appointment for colposcopy is 05/23/14 at 1:15.

## 2014-04-20 NOTE — Telephone Encounter (Signed)
Called Sara Arroyo and gave her the results of her pap smear and need for colposcopy. Gave her the appointment for her colposcopy and answered her questions. Sara Arroyo voices understanding.

## 2014-05-05 ENCOUNTER — Telehealth: Payer: Self-pay | Admitting: *Deleted

## 2014-05-05 NOTE — Telephone Encounter (Signed)
Pt called the clinic stating she was treated for an STD in the past weeks and is now experiencing issues with discharge odor and thick discharge.  Pt is concerned that she may be reinfected.  Contacted patient, discussed patient's concerns and she desires to be retested and examined today.  Pt states when she took antibiotics she had metallic taste in her mouth for 4 days.  Advised patient to go to MAU to be seen, pt verbalizes understanding.

## 2014-05-13 ENCOUNTER — Ambulatory Visit (INDEPENDENT_AMBULATORY_CARE_PROVIDER_SITE_OTHER): Payer: Medicaid Other | Admitting: Obstetrics and Gynecology

## 2014-05-13 VITALS — BP 115/61 | HR 77 | Wt 108.0 lb

## 2014-05-13 DIAGNOSIS — Z3042 Encounter for surveillance of injectable contraceptive: Secondary | ICD-10-CM | POA: Diagnosis not present

## 2014-05-13 DIAGNOSIS — B3731 Acute candidiasis of vulva and vagina: Secondary | ICD-10-CM

## 2014-05-13 DIAGNOSIS — B373 Candidiasis of vulva and vagina: Secondary | ICD-10-CM

## 2014-05-13 MED ORDER — FLUCONAZOLE 150 MG PO TABS
150.0000 mg | ORAL_TABLET | Freq: Once | ORAL | Status: DC
Start: 2014-05-13 — End: 2014-11-04

## 2014-05-13 MED ORDER — MEDROXYPROGESTERONE ACETATE 104 MG/0.65ML ~~LOC~~ SUSP
104.0000 mg | Freq: Once | SUBCUTANEOUS | Status: AC
  Administered 2014-05-13: 104 mg via SUBCUTANEOUS

## 2014-05-13 NOTE — Progress Notes (Signed)
PT HERE for depo provera 104 mg injection. Pt stated c/o vaginal itching with yellow discharge. Per standing order I e-prescribed diflucan 150 mg 1 dose to pharmacy on file. Pt agrees and satisfied.

## 2014-05-23 ENCOUNTER — Ambulatory Visit (INDEPENDENT_AMBULATORY_CARE_PROVIDER_SITE_OTHER): Payer: Medicaid Other | Admitting: Obstetrics & Gynecology

## 2014-05-23 ENCOUNTER — Other Ambulatory Visit (HOSPITAL_COMMUNITY)
Admission: RE | Admit: 2014-05-23 | Discharge: 2014-05-23 | Disposition: A | Discharge status: Home / Self Care | Payer: Medicaid Other | Source: Ambulatory Visit | Attending: Obstetrics & Gynecology | Admitting: Obstetrics & Gynecology

## 2014-05-23 ENCOUNTER — Encounter: Payer: Self-pay | Admitting: Obstetrics & Gynecology

## 2014-05-23 VITALS — BP 125/60 | HR 80 | Temp 99.0°F | Wt 112.3 lb

## 2014-05-23 DIAGNOSIS — IMO0002 Reserved for concepts with insufficient information to code with codable children: Secondary | ICD-10-CM

## 2014-05-23 DIAGNOSIS — Z3202 Encounter for pregnancy test, result negative: Secondary | ICD-10-CM | POA: Diagnosis not present

## 2014-05-23 DIAGNOSIS — Z01812 Encounter for preprocedural laboratory examination: Secondary | ICD-10-CM | POA: Diagnosis present

## 2014-05-23 DIAGNOSIS — R87619 Unspecified abnormal cytological findings in specimens from cervix uteri: Secondary | ICD-10-CM | POA: Insufficient documentation

## 2014-05-23 DIAGNOSIS — R896 Abnormal cytological findings in specimens from other organs, systems and tissues: Secondary | ICD-10-CM | POA: Diagnosis not present

## 2014-05-23 LAB — POCT PREGNANCY, URINE: Preg Test, Ur: NEGATIVE

## 2014-05-23 NOTE — Patient Instructions (Signed)
Colposcopy, Care After Refer to this sheet in the next few weeks. These instructions provide you with information on caring for yourself after your procedure. Your health care provider may also give you more specific instructions. Your treatment has been planned according to current medical practices, but problems sometimes occur. Call your health care provider if you have any problems or questions after your procedure. WHAT TO EXPECT AFTER THE PROCEDURE  After your procedure, it is typical to have the following:  Cramping. This often goes away in a few minutes.  Soreness. This may last for 2 days.  Lightheadedness. Lie down for a few minutes if this occurs. You may also have some bleeding or dark discharge for a few days. You may need to wear a sanitary pad during this time. HOME CARE INSTRUCTIONS  Avoid sex, douching, and using tampons for 3 days or as directed by your health care provider.  Only take over-the-counter or prescription medicines as directed by your health care provider. Do not take aspirin because it can cause bleeding.  Continue to take birth control pills if you are on them.  Not all test results are available during your visit. If your test results are not back during the visit, make an appointment with your health care provider to find out the results. Do not assume everything is normal if you have not heard from your health care provider or the medical facility. It is important for you to follow up on all of your test results.  Follow your health care provider's advice regarding activity, follow-up visits, and follow-up Pap tests. SEEK MEDICAL CARE IF:  You develop a rash.  You have problems with your medicine. SEEK IMMEDIATE MEDICAL CARE IF:  You are bleeding heavily or are passing blood clots.  You have a fever.  You have abnormal vaginal discharge.  You are having cramps that do not go away after taking your pain medicine.  You feel lightheaded, dizzy, or  faint.  You have stomach pain. Document Released: 11/11/2012 Document Reviewed: 11/11/2012 [facility] Patient Information 2015 [place], [place]. This information is not intended to replace advice given to you by your health care provider. Make sure you discuss any questions you have with your health care provider. Colposcopy Colposcopy is a procedure to examine your cervix and vagina, or the area around the outside of your vagina, for abnormalities or signs of disease. The procedure is done using a lighted microscope called a colposcope. Tissue samples may be collected during the colposcopy if your health care provider finds any unusual cells. A colposcopy may be done if a woman has:  An abnormal Pap test. A Pap test is a medical test done to evaluate cells that are on the surface of the cervix.  A Pap test result that is suggestive of human papillomavirus (HPV). This virus can cause genital warts and is linked to the development of cervical cancer.  A sore on her cervix and the results of a Pap test were normal.  Genital warts on the cervix or in or around the outside of the vagina.  A mother who took the drug diethylstilbestrol (DES) while pregnant.  Painful intercourse.  Vaginal bleeding, especially after sexual intercourse. LET [facility] CARE PROVIDER KNOW ABOUT:  Any allergies you have.  All medicines you are taking, including vitamins, herbs, eye drops, creams, and over-the-counter medicines.  Previous problems you or members of your family have had with the use of anesthetics.  Any blood disorders you have.  Previous  surgeries you have had.  Medical conditions you have. RISKS AND COMPLICATIONS Generally, a colposcopy is a safe procedure. However, as with any procedure, complications can occur. Possible complications include:  Bleeding.  Infection.  Missed lesions. BEFORE THE PROCEDURE   Tell your health care provider if you have your menstrual period. A colposcopy  typically is not done during menstruation.  For 24 hours before the colposcopy, do not:  Douche.  Use tampons.  Use medicines, creams, or suppositories in the vagina.  Have sexual intercourse. PROCEDURE  During the procedure, you will be lying on your back with your feet in foot rests (stirrups). A warm metal or plastic instrument (speculum) will be placed in your vagina to keep it open and to allow the health care provider to see the cervix. The colposcope will be placed outside the vagina. It will be used to magnify and examine the cervix, vagina, and the area around the outside of the vagina. A small amount of liquid solution will be placed on the area that is to be viewed. This solution will make it easier to see the abnormal cells. Your health care provider will use tools to suck out mucus and cells from the canal of the cervix. Then he or she will record the location of the abnormal areas. If a biopsy is done during the procedure, a medicine will usually be given to numb the area (local anesthetic). You may feel mild pain or cramping while the biopsy is done. After the procedure, tissue samples collected during the biopsy will be sent to a lab for analysis. AFTER THE PROCEDURE  You will be given instructions on when to follow up with your health care provider for your test results. It is important to keep your appointment. Document Released: 04/13/2002 Document Revised: 09/23/2012 Document Reviewed: 08/20/2012 [facility] Patient Information 2015 [place], [place]. This information is not intended to replace advice given to you by your health care provider. Make sure you discuss any questions you have with your health care provider. Abnormal Pap Test Information During a Pap test, the cells on the surface of your cervix are checked to see if they look normal, abnormal, or if they show signs of having been altered by a certain type of virus called human papillomavirus, or HPV. Cervical cells that  have been affected by HPV are called dysplasia. Dysplasia is not cancer, but describes abnormal cells found on the surface of the cervix. Depending on the degree of dysplasia, some of the cells may be considered pre-cancerous and may turn into cancer over time if follow up with a caregiver is delayed.  WHAT DOES AN ABNORMAL PAP TEST MEAN? Having an abnormal pap test does not mean that you have cancer. However, certain types of abnormal pap tests can be a sign that a person is at a higher risk of developing cancer. Your caregiver will want to do other tests to find out more about the abnormal cells. Your abnormal Pap test results could show:   Small and uncertain changes that should be carefully watched.   Cervical dysplasia that has caused mild changes and can be followed over time.  Cervical dysplasia that is more severe and needs to be followed and treated to ensure the problem goes away.  Cancer.  When severe cervical dysplasia is found and treated early, it rarely will grow into cancer.  WHAT WILL BE DONE ABOUT MY ABNORMAL PAP TEST?  A colposcopy may be needed. This is a procedure where your cervix is  examined using light and magnification.  A small tissue sample of your cervix (biopsy) may need to be removed and then examined. This is often performed if there are areas that appear infected.  A sample of cells from the cervical canal may be removed with either a small brush or scraping instrument (curette). Based on the results of the procedures above, some caregivers may recommend either cryotherapy of the cervix or a surgical LEEP where a portion of the cervix is removed. LEEP is short for "loop electrical excisional procedure." Rarely, a caregiver may recommend a cone biopsy.This is a procedure where a small, cone-shaped sample of your cervix is taken out. The part that is taken out is the area where the abnormal cells are.  WHAT IF I HAVE A DYSPLASIA OR A CANCER? You may be  referred to a specialist. Radiation may also be a treatment for more advanced cancer. Having a hysterectomy is the last treatment option for dysplasia, but it is a more common treatment for someone with cancer. All treatment options will be discussed with you by your caregiver. WHAT SHOULD YOU DO AFTER BEING TREATED? If you have had an abnormal pap test, you should continue to have regular pap tests and check-ups as directed by your caregiver. Your cervical problem will be carefully watched so it does not get worse. Also, your caregiver can watch for, and treat, any new problems that may come up. Document Released: 05/08/2010 Document Revised: 05/18/2012 Document Reviewed: 01/17/2011 [facility] Patient Information 2015 [place], [place]. This information is not intended to replace advice given to you by your health care provider. Make sure you discuss any questions you have with your health care provider.

## 2014-05-23 NOTE — Progress Notes (Signed)
Patient ID: Sara Arroyo, female   DOB: 1990-07-05, 24 y.o.   MRN: [id number] Patient given informed consent, signed copy in the chart, time out was performed.  Placed in lithotomy position. Cervix viewed with speculum and colposcope after application of acetic acid.  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  yes Acetowhite lesions?yes at 11- 1:00 o'clock  Punctation?no Mosaicism?  no Abnormal vasculature?  no Biopsies?yes @ 12 o'clock ECC?no  As treated for Trich 04/2014. Patient was given post procedure instructions.  We will contact the pt with her results. I suspect that she will need co-testing in 1 year. Mohogany Toppins L. Harraway-Smith, M.D., Evern CoreFACOG

## 2014-05-24 ENCOUNTER — Telehealth: Payer: Self-pay

## 2014-05-24 NOTE — Telephone Encounter (Signed)
Called patient and informed her of results and recommendations. Patient verbalized understanding and gratitude. NO questions or concerns.

## 2014-05-24 NOTE — Telephone Encounter (Signed)
-----   Message from Willodean Rosenthalarolyn Harraway-Smith, MD sent at 05/24/2014  4:33 PM EDT ----- Please call pt.  She has LGSIL.  She should f/u for co-testing in 1 year.  Thx, clh-S

## 2014-08-03 ENCOUNTER — Ambulatory Visit: Payer: Medicaid Other

## 2014-08-04 ENCOUNTER — Ambulatory Visit (INDEPENDENT_AMBULATORY_CARE_PROVIDER_SITE_OTHER): Payer: Medicaid Other

## 2014-08-04 DIAGNOSIS — N938 Other specified abnormal uterine and vaginal bleeding: Secondary | ICD-10-CM

## 2014-08-04 DIAGNOSIS — Z3042 Encounter for surveillance of injectable contraceptive: Secondary | ICD-10-CM | POA: Diagnosis not present

## 2014-08-04 MED ORDER — MEDROXYPROGESTERONE ACETATE 150 MG/ML IM SUSP
150.0000 mg | Freq: Once | INTRAMUSCULAR | Status: AC
  Administered 2014-08-04: 150 mg via INTRAMUSCULAR

## 2014-10-20 ENCOUNTER — Ambulatory Visit (INDEPENDENT_AMBULATORY_CARE_PROVIDER_SITE_OTHER): Payer: Medicaid Other | Admitting: *Deleted

## 2014-10-20 DIAGNOSIS — Z3042 Encounter for surveillance of injectable contraceptive: Secondary | ICD-10-CM

## 2014-10-20 MED ORDER — MEDROXYPROGESTERONE ACETATE 150 MG/ML IM SUSP
150.0000 mg | Freq: Once | INTRAMUSCULAR | Status: AC
  Administered 2014-10-20: 150 mg via INTRAMUSCULAR

## 2014-10-20 NOTE — Progress Notes (Signed)
Pt complains of bleeding on/off since 2012, would like to see provider.  Encouraged patient to make an appointment to see a provider to discuss bleeding.

## 2014-11-04 ENCOUNTER — Ambulatory Visit (INDEPENDENT_AMBULATORY_CARE_PROVIDER_SITE_OTHER): Payer: Medicaid Other | Admitting: Advanced Practice Midwife

## 2014-11-04 ENCOUNTER — Encounter: Payer: Self-pay | Admitting: Advanced Practice Midwife

## 2014-11-04 ENCOUNTER — Other Ambulatory Visit (HOSPITAL_COMMUNITY)
Admission: RE | Admit: 2014-11-04 | Discharge: 2014-11-04 | Disposition: A | Discharge status: Home / Self Care | Payer: Medicaid Other | Source: Ambulatory Visit | Attending: Advanced Practice Midwife | Admitting: Advanced Practice Midwife

## 2014-11-04 VITALS — BP 111/52 | HR 62 | Temp 99.0°F | Wt 108.9 lb

## 2014-11-04 DIAGNOSIS — Z118 Encounter for screening for other infectious and parasitic diseases: Secondary | ICD-10-CM

## 2014-11-04 DIAGNOSIS — Z202 Contact with and (suspected) exposure to infections with a predominantly sexual mode of transmission: Secondary | ICD-10-CM | POA: Diagnosis not present

## 2014-11-04 DIAGNOSIS — Z113 Encounter for screening for infections with a predominantly sexual mode of transmission: Secondary | ICD-10-CM

## 2014-11-04 DIAGNOSIS — Z711 Person with feared health complaint in whom no diagnosis is made: Secondary | ICD-10-CM

## 2014-11-04 LAB — WET PREP, GENITAL
Trich, Wet Prep: NONE SEEN
YEAST WET PREP: NONE SEEN

## 2014-11-04 LAB — RPR

## 2014-11-04 NOTE — Progress Notes (Signed)
  Subjective:    Sara Arroyo is a 24 y.o. female who presents for sexually transmitted disease check. Sexual history reviewed with the patient. STI Exposure: sexual contact with individual with uncertain background1  week ago and multiple sexual partners: several in past years. Previous history of STI chlamydia, trichomonas. Current symptoms none. Contraception: Depo-Provera injections  Wants to be tested  States has some bleeding toward end of DepoProvera cycle. Had some right before last shot. Then stopped with shot.   Menstrual History: OB History    Gravida Para Term Preterm AB TAB SAB Ectopic Multiple Living   No LMP recorded. Patient has had an injection.    The following portions of the patient's history were reviewed and updated as appropriate: allergies, current medications, past family history, past medical history, past social history, past surgical history and problem list.  Review of Systems Pertinent items are noted in HPI.    Objective:    BP 111/52 mmHg  Pulse 62  Temp(Src) 99 F (37.2 C)  Wt 108 lb 14.4 oz (49.397 kg) General:   alert, cooperative and no distress  Lymph Nodes:   Cervical, supraclavicular, and axillary nodes normal.  Pelvis:  Vulva and vagina appear normal. Bimanual exam reveals normal uterus and adnexa. Clinical staff offered to be present for exam: yes  Initials: RN  Cultures:  GC and Chlamydia genprobes and Wet prep and HbSAG, HepCantibody, RPR, and HIV at patient's request     Assessment:    Possible STD exposure    Plan:    Discussed safe sexual practice in detail See orders for STD cultures and assays Will call pt with results RTC PRN If tests are positive. Patient requests call with results "no matter what", but I am not sure if we will be able to do that.

## 2014-11-04 NOTE — Patient Instructions (Signed)

## 2014-11-05 LAB — HEPATITIS C ANTIBODY: HCV Ab: NEGATIVE

## 2014-11-05 LAB — HEPATITIS B SURFACE ANTIGEN: HEP B S AG: NEGATIVE

## 2014-11-05 LAB — HIV ANTIBODY (ROUTINE TESTING W REFLEX): HIV: NONREACTIVE

## 2014-11-07 LAB — GC/CHLAMYDIA PROBE AMP (~~LOC~~) NOT AT ARMC
Chlamydia: NEGATIVE
NEISSERIA GONORRHEA: NEGATIVE

## 2014-11-08 ENCOUNTER — Telehealth: Payer: Self-pay | Admitting: General Practice

## 2014-11-08 DIAGNOSIS — N760 Acute vaginitis: Principal | ICD-10-CM

## 2014-11-08 DIAGNOSIS — B9689 Other specified bacterial agents as the cause of diseases classified elsewhere: Secondary | ICD-10-CM

## 2014-11-08 MED ORDER — METRONIDAZOLE 500 MG PO TABS
500.0000 mg | ORAL_TABLET | Freq: Two times a day (BID) | ORAL | Status: DC
Start: 2014-11-08 — End: 2018-01-29

## 2014-11-08 NOTE — Telephone Encounter (Signed)
Patient called and left message requesting test results. Called patient and informed her of results and BV shown on wet prep. Informed patient of medication sent to pharmacy that she can go by and pick up. Patient verbalized understanding and asked if that was the one pill or the pill that changes her taste buds. Told patient I have not heard that about the medication but the medication is flagyl or metronidazole and she will take it twice a day for a week. Patient verbalized understanding and had no other questions

## 2014-11-14 ENCOUNTER — Emergency Department (HOSPITAL_COMMUNITY)
Admission: EM | Admit: 2014-11-14 | Discharge: 2014-11-14 | Disposition: A | Discharge status: Home / Self Care | Payer: Medicaid Other | Attending: Emergency Medicine | Admitting: Emergency Medicine

## 2014-11-14 ENCOUNTER — Encounter (HOSPITAL_COMMUNITY): Payer: Self-pay | Admitting: Emergency Medicine

## 2014-11-14 DIAGNOSIS — R0981 Nasal congestion: Secondary | ICD-10-CM | POA: Diagnosis present

## 2014-11-14 DIAGNOSIS — H6692 Otitis media, unspecified, left ear: Secondary | ICD-10-CM | POA: Insufficient documentation

## 2014-11-14 DIAGNOSIS — Z79899 Other long term (current) drug therapy: Secondary | ICD-10-CM | POA: Diagnosis not present

## 2014-11-14 DIAGNOSIS — J019 Acute sinusitis, unspecified: Secondary | ICD-10-CM | POA: Diagnosis not present

## 2014-11-14 MED ORDER — IBUPROFEN 200 MG PO TABS: 600.0000 mg | ORAL_TABLET | Freq: Once | ORAL | Status: DC

## 2014-11-14 MED ORDER — ONDANSETRON HCL 4 MG PO TABS: 4.0000 mg | ORAL_TABLET | Freq: Three times a day (TID) | ORAL | Status: DC | PRN

## 2014-11-14 MED ORDER — AMOXICILLIN-POT CLAVULANATE 875-125 MG PO TABS: 1.0000 | ORAL_TABLET | Freq: Two times a day (BID) | ORAL | Status: DC

## 2014-11-14 MED ORDER — ONDANSETRON 4 MG PO TBDP
4.0000 mg | ORAL_TABLET | Freq: Once | ORAL | Status: DC
Start: 2014-11-14 — End: 2014-11-14

## 2014-11-14 NOTE — Discharge Instructions (Signed)
Read the information below.  Use the prescribed medication as directed.  Please discuss all new medications with your pharmacist.  You may return to the Emergency Department at any time for worsening condition or any new symptoms that concern you.  If you develop high fevers that do not resolve with tylenol or ibuprofen, you have difficulty swallowing or breathing, you have uncontrolled pain, or you are unable to tolerate fluids by mouth, return to the ER for a recheck.      Otitis Media, Adult Otitis media is redness, soreness, and puffiness (swelling) in the space just behind your eardrum (middle ear). It may be caused by allergies or infection. It often happens along with a cold. HOME CARE  Take your medicine as told. Finish it even if you start to feel better.  Only take over-the-counter or prescription medicines for pain, discomfort, or fever as told by your doctor.  Follow up with your doctor as told. GET HELP IF:  You have otitis media only in one ear, or bleeding from your nose, or both.  You notice a lump on your neck.  You are not getting better in 3-5 days.  You feel worse instead of better. GET HELP RIGHT AWAY IF:   You have pain that is not helped with medicine.  You have puffiness, redness, or pain around your ear.  You get a stiff neck.  You cannot move part of your face (paralysis).  You notice that the bone behind your ear hurts when you touch it. MAKE SURE YOU:   Understand these instructions.  Will watch your condition.  Will get help right away if you are not doing well or get worse.   This information is not intended to replace advice given to you by your health care provider. Make sure you discuss any questions you have with your health care provider.   Document Released: 07/10/2007 Document Revised: 02/11/2014 Document Reviewed: 08/18/2012 Elsevier Interactive Patient Education 2016 [employer].  Sinusitis, Adult Sinusitis is redness, soreness,  and puffiness (inflammation) of the air pockets in the bones of your face (sinuses). The redness, soreness, and puffiness can cause air and mucus to get trapped in your sinuses. This can allow germs to grow and cause an infection.  HOME CARE   Drink enough fluids to keep your pee (urine) clear or pale yellow.  Use a humidifier in your home.  Run a hot shower to create steam in the bathroom. Sit in the bathroom with the door closed. Breathe in the steam 3-4 times a day.  Put a warm, moist washcloth on your face 3-4 times a day, or as told by your doctor.  Use salt water sprays (saline sprays) to wet the thick fluid in your nose. This can help the sinuses drain.  Only take medicine as told by your doctor. GET HELP RIGHT AWAY IF:   Your pain gets worse.  You have very bad headaches.  You are sick to your stomach (nauseous).  You throw up (vomit).  You are very sleepy (drowsy) all the time.  Your face is puffy (swollen).  Your vision changes.  You have a stiff neck.  You have trouble breathing. MAKE SURE YOU:   Understand these instructions.  Will watch your condition.  Will get help right away if you are not doing well or get worse.   This information is not intended to replace advice given to you by your health care provider. Make sure you discuss any questions you have  with your health care provider.   Document Released: 07/10/2007 Document Revised: 02/11/2014 Document Reviewed: 08/27/2011 Elsevier Interactive Patient Education [employer]

## 2014-11-14 NOTE — ED Notes (Signed)
Per pt, states cold symptoms for over a week-abdominal pain, N/V/D

## 2014-11-14 NOTE — ED Notes (Signed)
This Consulting civil engineer was informed that the Pt was cursing and yelling at staff.  This RN found the Pt standing in the hallway yelling that she needed to be seen.  Pt directed back into room where she proceeded to yell at this Charge RN about "calling out 10 times and nobody ever showed up."  This Charge RN apologized informed the Pt that I would get her medications and discharge paperwork.  Pt continued cursing and escalating.  Pt informed that we would not tolerate her behavior and if she did not calm down, then she would be escorted out.  Pt continued cursing and was promptly escorted out by security.  Discharge paperwork provided while Pt was walking out.

## 2014-11-14 NOTE — ED Provider Notes (Signed)
CSN: [id number]     Arrival date & time 11/14/14  6578 History   First MD Initiated Contact with Patient 11/14/14 [phone]     Chief Complaint  Patient presents with  . URI     (Consider location/radiation/quality/duration/timing/severity/associated sxs/prior Treatment) The history is provided by the patient.     Pt has been sick for 9 days with nasal congestion, rhinorrhea, left ear pain, left ear decreased hearing, cough, N/V/D.  Denies sore throat, CP, SOB, abdominal pain.  Has been taking OTC medications such as sudafed and tylenol without relief.  Niece and daughter have been sick with similar symptoms.     Past Medical History  Diagnosis Date  . Abnormal Pap smear    Past Surgical History  Procedure Laterality Date  . Colposcopy  10/2011   Family History  Problem Relation Age of Onset  . Cancer Father    Social History  Substance Use Topics  . Smoking status: Never Smoker   . Smokeless tobacco: Never Used  . Alcohol Use: No   OB History    Gravida Para Term Preterm AB TAB SAB Ectopic Multiple Living   Review of Systems  All other systems reviewed and are negative.     Allergies  Review of patient's allergies indicates no known allergies.  Home Medications   Prior to Admission medications   Medication Sig Start Date End Date Taking? Authorizing Provider  medroxyPROGESTERone (DEPO-PROVERA) 150 MG/ML injection Inject 150 mg into the muscle every 3 (three) months.    Historical Provider, MD  metroNIDAZOLE (FLAGYL) 500 MG tablet Take 1 tablet (500 mg total) by mouth 2 (two) times daily. 11/08/14   Adam Phenix, MD   BP 118/62 mmHg  Pulse 96  Temp(Src) 98.4 F (36.9 C) (Oral)  Resp 18  SpO2 100% Physical Exam  Constitutional: She appears well-developed and well-nourished. No distress.  HENT:  Head: Normocephalic and atraumatic.  Right Ear: Tympanic membrane and ear canal normal.  Left Ear: Ear canal normal. Tympanic membrane is  bulging.  Nose: Mucosal edema present. Right sinus exhibits frontal sinus tenderness. Right sinus exhibits no maxillary sinus tenderness. Left sinus exhibits maxillary sinus tenderness and frontal sinus tenderness.  Mouth/Throat: Oropharynx is clear and moist. No oropharyngeal exudate.  Left TM bulging with purulent material behind TM.    Eyes: Conjunctivae and EOM are normal. Right eye exhibits no discharge. Left eye exhibits no discharge. No scleral icterus.  Neck: Normal range of motion. Neck supple.  Cardiovascular: Normal rate and regular rhythm.   Pulmonary/Chest: Effort normal and breath sounds normal. No respiratory distress. She has no wheezes. She has no rales.  Abdominal: Soft. She exhibits no distension. There is no tenderness. There is no rebound and no guarding.  Neurological: She is alert.  Skin: No rash noted. She is not diaphoretic.  Nursing note and vitals reviewed.   ED Course  Procedures (including critical care time) Labs Review Labs Reviewed - No data to display  Imaging Review No results found. I have personally reviewed and evaluated these images and lab results as part of my medical decision-making.   EKG Interpretation None      MDM   Final diagnoses:  Acute left otitis media, recurrence not specified, unspecified otitis media type  Acute sinusitis, recurrence not specified, unspecified location    Afebrile nontoxic patient with 9 days of worsening URI symptoms including sinus pain and pressure and  left otitis media.  Given duration of symptoms and worsening course, will treat with antibiotics.  Pt also with N/V/D but no abdominal pain, urinary or vaginal symptoms.  Has been dx recently with BV at [facility], not taking antibiotics.  Pt given PO zofran, declined IVF.  Pt d/c with zofran, augmentin.  PCP follow up PRN.  I was informed well after patient's discharge that she had become impatient and abusive toward staff and was escorted out - she was given her  prescriptions prior to leaving.      Trixie Dredge, PA-C 11/14/14 1107  Doug Sou, MD 11/14/14 [phone]

## 2015-01-05 ENCOUNTER — Ambulatory Visit: Payer: Medicaid Other

## 2015-01-09 ENCOUNTER — Ambulatory Visit (INDEPENDENT_AMBULATORY_CARE_PROVIDER_SITE_OTHER): Payer: Medicaid Other | Admitting: General Practice

## 2015-01-09 VITALS — BP 108/65 | HR 91 | Temp 98.6°F | Ht 60.0 in | Wt 111.0 lb

## 2015-01-09 DIAGNOSIS — Z3042 Encounter for surveillance of injectable contraceptive: Secondary | ICD-10-CM | POA: Diagnosis not present

## 2015-01-09 MED ORDER — MEDROXYPROGESTERONE ACETATE 150 MG/ML IM SUSP
150.0000 mg | Freq: Once | INTRAMUSCULAR | Status: AC
  Administered 2015-01-09: 150 mg via INTRAMUSCULAR

## 2015-03-27 ENCOUNTER — Ambulatory Visit: Payer: Medicaid Other

## 2015-04-13 ENCOUNTER — Encounter: Payer: Self-pay | Admitting: Medical

## 2015-04-13 ENCOUNTER — Ambulatory Visit: Payer: Medicaid Other | Admitting: Medical

## 2015-04-13 ENCOUNTER — Telehealth: Payer: Self-pay | Admitting: Family Medicine

## 2015-04-13 NOTE — Progress Notes (Unsigned)
Patient ID: Sara Arroyo, female   DOB: 1990/10/24, 25 y.o.   MRN: [id number] Patient became very irate and verbally abusive toward staff, due to wait time. Patient repeatly yelled that she would need to be seen or given back her 3.00 copayment. Patient would not calm down and stop yelling at staff, after attempts of team members to calm patient down. Staff ensured her that she would be seen or receive her refund back. Informed patient that we were calling security, patient proceeded to scream and demand copayment and being verbally abusive.

## 2015-04-13 NOTE — Telephone Encounter (Deleted)
Patient became very irate and verbally abusive toward staff, due to wait time. Patient repeatly yelled that she would need to be seen or given back her 3.00 copayment. Patient would not calm down and stop yelling at staff, after attempts of team members to calm patient down. Staff ensured her that she would be seen or receive her refund back. Informed patient that we were calling security, patient proceeded to scream and demand copayment and being verbally abusive.

## 2015-04-17 ENCOUNTER — Encounter: Payer: Self-pay | Admitting: *Deleted

## 2015-05-08 NOTE — Telephone Encounter (Signed)
Opened in error

## 2015-10-25 ENCOUNTER — Encounter (HOSPITAL_COMMUNITY): Payer: Self-pay | Admitting: *Deleted

## 2015-10-25 ENCOUNTER — Emergency Department (HOSPITAL_COMMUNITY)
Admission: EM | Admit: 2015-10-25 | Discharge: 2015-10-25 | Disposition: A | Discharge status: Home / Self Care | Payer: Medicaid Other | Attending: Emergency Medicine | Admitting: Emergency Medicine

## 2015-10-25 DIAGNOSIS — Z5321 Procedure and treatment not carried out due to patient leaving prior to being seen by health care provider: Secondary | ICD-10-CM | POA: Insufficient documentation

## 2015-10-25 DIAGNOSIS — R21 Rash and other nonspecific skin eruption: Secondary | ICD-10-CM | POA: Diagnosis not present

## 2015-10-25 DIAGNOSIS — Z792 Long term (current) use of antibiotics: Secondary | ICD-10-CM | POA: Diagnosis not present

## 2015-10-25 DIAGNOSIS — Z79899 Other long term (current) drug therapy: Secondary | ICD-10-CM | POA: Insufficient documentation

## 2015-10-25 NOTE — ED Triage Notes (Signed)
Pt reports noticing dry and itchy rash all over her skin x 1.5 weeks.

## 2016-06-20 ENCOUNTER — Emergency Department (HOSPITAL_COMMUNITY)
Admission: EM | Admit: 2016-06-20 | Discharge: 2016-06-20 | Disposition: A | Discharge status: Home / Self Care | Payer: Medicaid Other | Attending: Emergency Medicine | Admitting: Emergency Medicine

## 2016-06-20 ENCOUNTER — Encounter (HOSPITAL_COMMUNITY): Payer: Self-pay | Admitting: Emergency Medicine

## 2016-06-20 DIAGNOSIS — M549 Dorsalgia, unspecified: Secondary | ICD-10-CM | POA: Diagnosis not present

## 2016-06-20 DIAGNOSIS — R079 Chest pain, unspecified: Secondary | ICD-10-CM | POA: Diagnosis not present

## 2016-06-20 DIAGNOSIS — E876 Hypokalemia: Secondary | ICD-10-CM

## 2016-06-20 DIAGNOSIS — R300 Dysuria: Secondary | ICD-10-CM | POA: Diagnosis not present

## 2016-06-20 DIAGNOSIS — R197 Diarrhea, unspecified: Secondary | ICD-10-CM | POA: Insufficient documentation

## 2016-06-20 DIAGNOSIS — Z79899 Other long term (current) drug therapy: Secondary | ICD-10-CM | POA: Diagnosis not present

## 2016-06-20 DIAGNOSIS — R112 Nausea with vomiting, unspecified: Secondary | ICD-10-CM | POA: Diagnosis present

## 2016-06-20 LAB — COMPREHENSIVE METABOLIC PANEL
ALBUMIN: 4 g/dL (ref 3.5–5.0)
ALT: 15 U/L (ref 14–54)
AST: 22 U/L (ref 15–41)
Alkaline Phosphatase: 48 U/L (ref 38–126)
Anion gap: 9 (ref 5–15)
BILIRUBIN TOTAL: 0.3 mg/dL (ref 0.3–1.2)
BUN: 12 mg/dL (ref 6–20)
CHLORIDE: 110 mmol/L (ref 101–111)
CO2: 21 mmol/L — ABNORMAL LOW (ref 22–32)
Calcium: 8.8 mg/dL — ABNORMAL LOW (ref 8.9–10.3)
Creatinine, Ser: 0.91 mg/dL (ref 0.44–1.00)
GFR calc Af Amer: >60 mL/min (ref >60)
GFR calc non Af Amer: >60 mL/min (ref >60)
GLUCOSE: 82 mg/dL (ref 65–99)
Potassium: 3.1 mmol/L — ABNORMAL LOW (ref 3.5–5.1)
Sodium: 140 mmol/L (ref 135–145)
Total Protein: 6.1 g/dL — ABNORMAL LOW (ref 6.5–8.1)

## 2016-06-20 LAB — URINALYSIS, ROUTINE W REFLEX MICROSCOPIC
BACTERIA UA: NONE SEEN
Bilirubin Urine: NEGATIVE
GLUCOSE, UA: NEGATIVE mg/dL
Hgb urine dipstick: NEGATIVE
Ketones, ur: 20 mg/dL — AB
Leukocytes, UA: NEGATIVE
Nitrite: NEGATIVE
PROTEIN: 30 mg/dL — AB
RBC / HPF: NONE SEEN RBC/hpf (ref 0–5)
Specific Gravity, Urine: 1.031 — ABNORMAL HIGH (ref 1.005–1.030)
WBC, UA: NONE SEEN WBC/hpf (ref 0–5)
pH: 5 (ref 5.0–8.0)

## 2016-06-20 LAB — I-STAT TROPONIN, ED: TROPONIN I, POC: 0 ng/mL (ref 0.00–0.08)

## 2016-06-20 LAB — CBC WITH DIFFERENTIAL/PLATELET
Basophils Absolute: 0 10*3/uL (ref 0.0–0.1)
Basophils Relative: 0 %
EOS PCT: 1 %
Eosinophils Absolute: 0 10*3/uL (ref 0.0–0.7)
HCT: 41.2 % (ref 36.0–46.0)
Hemoglobin: 14.3 g/dL (ref 12.0–15.0)
LYMPHS ABS: 2 10*3/uL (ref 0.7–4.0)
LYMPHS PCT: 32 %
MCH: 31.6 pg (ref 26.0–34.0)
MCHC: 34.7 g/dL (ref 30.0–36.0)
MCV: 90.9 fL (ref 78.0–100.0)
MONO ABS: 0.5 10*3/uL (ref 0.1–1.0)
MONOS PCT: 8 %
Neutro Abs: 3.7 10*3/uL (ref 1.7–7.7)
Neutrophils Relative %: 59 %
Platelets: 145 10*3/uL — ABNORMAL LOW (ref 150–400)
RBC: 4.53 MIL/uL (ref 3.87–5.11)
RDW: 12.6 % (ref 11.5–15.5)
WBC: 6.2 10*3/uL (ref 4.0–10.5)

## 2016-06-20 LAB — POC URINE PREG, ED: PREG TEST UR: NEGATIVE

## 2016-06-20 LAB — RAPID URINE DRUG SCREEN, HOSP PERFORMED
Amphetamines: NOT DETECTED
Barbiturates: NOT DETECTED
Benzodiazepines: NOT DETECTED
Cocaine: NOT DETECTED
Opiates: NOT DETECTED
Tetrahydrocannabinol: POSITIVE — AB

## 2016-06-20 MED ORDER — POTASSIUM CHLORIDE CRYS ER 20 MEQ PO TBCR
40.0000 meq | EXTENDED_RELEASE_TABLET | Freq: Once | ORAL | Status: AC
  Administered 2016-06-20: 40 meq via ORAL
  Filled 2016-06-20: qty 2

## 2016-06-20 MED ORDER — SODIUM CHLORIDE 0.9 % IV BOLUS (SEPSIS)
1000.0000 mL | Freq: Once | INTRAVENOUS | Status: AC
  Administered 2016-06-20: 1000 mL via INTRAVENOUS

## 2016-06-20 NOTE — ED Triage Notes (Signed)
Pt sts N/V/D x 2 weeks with weight loss and back pain

## 2016-06-20 NOTE — ED Provider Notes (Signed)
[facility]-EMERGENCY DEPT Provider Note   CSN: [id number] Arrival date & time: 06/20/16  1013     History   Chief Complaint Chief Complaint  Patient presents with  . Emesis    HPI Sara Arroyo is a 26 y.o. female who presents with 2 weeks of multiple complaints. Patient states that for the last 2 weeks she has been intermittently vomiting. She states that she has one episode of vomiting every few days and states that emesis looks like the food that she has been eating. She states that last night she had an episode of emesis that looked like the food was some blood-tinged spots. No gross blood Or coffee ground emesis. She also reports intermittent diarrhea for the past 2 weeks and notes that her stools have been orange in color and foul smelling few days. She reports multiple episodes of nonbloody diarrhea every day. She reports that yesterday she was sitting on the couch and had an episode of diarrhea before she was able to make it to the bathroom. She also reports having some urinary discomfort for the past few days. She notes yesterday that her pee was "black and green" last night which made her concerned. Patient reports that she's been having intermittent chest pain and abdominal pain and states "the pain does go all over my body and I just feel achy everywhere." Patient reports a history of chronic back pain related back to an accident she had several years ago. Patient reports that she's been having right-sided back pain consistently over the past few years. She reports that history of back pain is a chronic problem related to an accident that she was previously in. She denies no new injury. Patient states that she is worried because "she's too young to have all these problems and she is concerned because some family members just died from cancer and she doesn't want to die from cancer." Patient states she is very concerned about having cancer and wants to know she has it. Patient denies any  history of IV drug use, back surgery, numbness/weakness of her arms or legs, fevers saddle anesthesia, bowel/bladder incontinence. Patient is not any OCPs. She reports that she was previously on Depo-Provera states that she has not been on that for the last 2-3 months. She denies any recent long travel, surgeries, immobilization, history of blood clots.   The history is provided by the patient.    Past Medical History:  Diagnosis Date  . Abnormal Pap smear     Patient Active Problem List   Diagnosis Date Noted  . Concern about STD in female without diagnosis 11/04/2014  . Encounter for Depo-Provera contraception 09/03/2012  . DUB (dysfunctional uterine bleeding) 09/03/2012  . Weight loss, non-intentional 08/30/2011    Past Surgical History:  Procedure Laterality Date  . COLPOSCOPY  10/2011    OB History    Gravida Para Term Preterm AB Living   2 1 1   1 1    SAB TAB Ectopic Multiple Live Births     1             Home Medications    Prior to Admission medications   Medication Sig Start Date End Date Taking? Authorizing Provider  amoxicillin-clavulanate (AUGMENTIN) 875-125 MG tablet Take 1 tablet by mouth 2 (two) times daily. 11/14/14   Trixie Dredge, PA-C  Diphenhydramine-APAP (TYLENOL COLD RELIEF NIGHTTIME) 25-500 MG/15ML LIQD Take 30 mLs by mouth every 4 (four) hours as needed (cold symptoms).    [provider]  medroxyPROGESTERone (DEPO-PROVERA) 150 MG/ML injection Inject 150 mg into the muscle every 3 (three) months.    [provider]  metroNIDAZOLE (FLAGYL) 500 MG tablet Take 1 tablet (500 mg total) by mouth 2 (two) times daily. 11/08/14   Adam Phenix, MD  ondansetron (ZOFRAN) 4 MG tablet Take 1 tablet (4 mg total) by mouth every 8 (eight) hours as needed for nausea or vomiting. 11/14/14   Trixie Dredge, PA-C  pseudoephedrine (SUDAFED) 60 MG tablet Take 120 mg by mouth once.    [provider]    Family History Family History  Problem  Relation Age of Onset  . Cancer Father     Social History Social History  Substance Use Topics  . Smoking status: Never Smoker  . Smokeless tobacco: Never Used  . Alcohol use No     Allergies   Patient has no known allergies.   Review of Systems Review of Systems  Constitutional: Negative for chills, diaphoresis and fever.  HENT: Negative for congestion, rhinorrhea and sore throat.   Eyes: Negative for visual disturbance.  Respiratory: Negative for cough and shortness of breath.   Cardiovascular: Positive for chest pain.  Gastrointestinal: Positive for abdominal pain, diarrhea, nausea and vomiting. Negative for blood in stool.  Genitourinary: Positive for dysuria. Negative for hematuria, vaginal bleeding and vaginal discharge.  Musculoskeletal: Positive for back pain. Negative for neck pain.  Skin: Negative for rash.  Neurological: Negative for dizziness, weakness, numbness and headaches.  Psychiatric/Behavioral: Negative for confusion.  All other systems reviewed and are negative.    Physical Exam Updated Vital Signs BP 119/69   Pulse 90   Temp 98.3 F (36.8 C) (Oral)   Resp 16   Wt 45 kg   SpO2 100%   BMI 21.45 kg/m   Physical Exam  Constitutional: She is oriented to person, place, and time. She appears well-developed and well-nourished.  Appears anxious  HENT:  Head: Normocephalic and atraumatic.  Mouth/Throat: Oropharynx is clear and moist and mucous membranes are normal.  Eyes: Conjunctivae, EOM and lids are normal. Pupils are equal, round, and reactive to light.  Small pupils but reactive bilaterally.  Neck: Full passive range of motion without pain. No spinous process tenderness and no muscular tenderness present.  Flexion/extension and lateral movement of neck without any difficulty. No midline bony tenderness.   Cardiovascular: Normal rate, regular rhythm, normal heart sounds and normal pulses.  Exam reveals no gallop and no friction rub.   No murmur  heard. Pulmonary/Chest: Effort normal and breath sounds normal.  Abdominal: Soft. Normal appearance. There is no tenderness. There is no rigidity, no guarding and no CVA tenderness.  Abdomen soft and nondistended. No focal tenderness. No peritoneal signs. No CVA tenderness bilaterally.  Musculoskeletal: Normal range of motion.       Cervical back: She exhibits no tenderness.       Thoracic back: She exhibits no tenderness.       Lumbar back: She exhibits no tenderness.       Back:  No midline tenderness to C, T, or L spine. No deformities or crepitus noted. No step-offs. Patient has right-sided paraspinal tenderness of the thoracic/upper lumbar region. (Patient reports that this is chronic and not new).   Neurological: She is alert and oriented to person, place, and time. GCS eye subscore is 4. GCS verbal subscore is 5. GCS motor subscore is 6.  Follows all commands. 5/5 strength BUE and BLE Normal grip strength. Sensation intact throughout  all major nerve distributions No gait abnormalities  Skin: Skin is warm and dry. Capillary refill takes less than 2 seconds.  Psychiatric: Her mood appears anxious. Her speech is rapid and/or pressured.  Nursing note and vitals reviewed.    ED Treatments / Results  Labs (all labs ordered are listed, but only abnormal results are displayed) Labs Reviewed  URINALYSIS, ROUTINE W REFLEX MICROSCOPIC - Abnormal; Notable for the following:       Result Value   APPearance TURBID (*)    Specific Gravity, Urine 1.031 (*)    Ketones, ur 20 (*)    Protein, ur 30 (*)    Squamous Epithelial / LPF 6-30 (*)    All other components within normal limits  COMPREHENSIVE METABOLIC PANEL - Abnormal; Notable for the following:    Potassium 3.1 (*)    CO2 21 (*)    Calcium 8.8 (*)    Total Protein 6.1 (*)    All other components within normal limits  CBC WITH DIFFERENTIAL/PLATELET - Abnormal; Notable for the following:    Platelets 145 (*)    All other  components within normal limits  RAPID URINE DRUG SCREEN, HOSP PERFORMED - Abnormal; Notable for the following:    Tetrahydrocannabinol POSITIVE (*)    All other components within normal limits  POC URINE PREG, ED  I-STAT TROPOININ, ED    EKG  EKG Interpretation  Date/Time:  Thursday Jun 20 2016 11:29:06 EDT Ventricular Rate:  78 PR Interval:    QRS Duration: 86 QT Interval:  409 QTC Calculation: 466 R Axis:   93 Text Interpretation:  Sinus rhythm Borderline short PR interval Consider right atrial enlargement Borderline right axis deviation Confirmed by Rubin Payor  MD, Harrold Donath [phone]) on 06/20/2016 12:48:29 PM       Radiology No results found.  Procedures Procedures (including critical care time)  Medications Ordered in ED Medications  sodium chloride 0.9 % bolus 1,000 mL (0 mLs Intravenous Stopped 06/20/16 1245)  potassium chloride SA (K-DUR,KLOR-CON) CR tablet 40 mEq (40 mEq Oral Given 06/20/16 1323)     Initial Impression / Assessment and Plan / ED Course  I have reviewed the triage vital signs and the nursing notes.  Pertinent labs & imaging results that were available during my care of the patient were reviewed by me and considered in my medical decision making (see chart for details).    26 year old female who presents with 2 weeks of multiple complaints. Throughout interview patient appears very anxious and has rapid and pressured speech. She states that she is very concerned about having cancer because her uncle just passed away from cancer and this is been on her mind quite a lot. She reports that she is very worried. No history of psychiatric illness. Patient's reported back pain is chronic in nature and related to an accident several years ago. She has no midline bony tenderness. No neuro deficits on exam. No red flag symptoms. No indications for imaging at this time. History/physical examination are not concerning for a PE. Patient is PERC negative. Consider anxiety  versus acute infectious etiology versus electrolyte imbalance versus pregnancy. Plan to evaluate with basic labs including CBC, CMP, UA, UDS, urine pregnancy, EKG, troponin.  Records reviewed. Patient was last seen here in Septra 2013. At that point she weight 47.5 kg. Today she weighs 45 kg.  Labs reviewed. CBC with no elevation of white blood count. H&H is stable. CMP shows low potassium but otherwise LFTs within normal limits. Urine pregnancy negative.  Urinalysis negative for leukocytes or nitrites. Does show presence of ketones and proteins likely secondary to dehydration. Urine drug screen is positive for marijuana. I-STAT troponin negative.  Discussed results with patient. Patient initially told me that she had not smoked marijuana in several months. When pressed again after urine drug screen results. She reports that she has smoked it 3 days ago. Patient is still concerned about having cancer once definitively know she does not have cancer. Explained patient that we cannot tell her that she definitely does not have cancer but that lab work is not concerning for any acute process going on. Explained that patient is best to follow-up with her primary care doctor for further evaluation.  Patient has ambulated without difficulty several times in the department. She has been able to drink and tolerate by mouth that without any nausea/vomiting. She is stable for discharge at this time. Jean RosenthalJackson patient to follow-up with her primary care doctor in the next 24-48 hours. Provided patient with a list of clinic resources to use if he does not have a PCP. Instructed to call them today to arrange follow-up in the next 24-48 hours. Strict return precautions discussed. Patient's persistent understanding and agreement to plan.  Final Clinical Impressions(s) / ED Diagnoses   Final diagnoses:  Non-intractable vomiting with nausea, unspecified vomiting type  Hypokalemia    New Prescriptions Discharge Medication  List as of 06/20/2016  1:34 PM       Maxwell CaulLayden, Lindsey A, PA-C 06/20/16 2202    Benjiman CorePickering, Nathan, MD 06/21/16 [id number]

## 2016-06-20 NOTE — ED Notes (Signed)
Provider at bedside

## 2016-06-20 NOTE — ED Notes (Signed)
Pt called out stating she was sweaty and thirsty.

## 2016-06-20 NOTE — Discharge Instructions (Signed)
Make sure you're staying hydrated and eating plenty of healthy food.    Follow-up with her primary care doctor in the next 24-48 hours. If you cannot get in with them or do not have a primary care doctor you can use one of the list below to arrange for an appointment.  Return the emergency Department for any fever, difficulty walking, persistent back pain, numbness/weakness of arms or legs, chest pain, difficulty breathing, persistent vomiting unable to keep any food down or any other worsening or concerning  symptoms.  If you do not have a primary care doctor you see regularly, please you the list below. Please call them to arrange for follow-up.    No Primary Care Doctor Call [facility]  [phone] Other agencies that provide inexpensive medical care    [facility]  [phone]    [facility]  [phone]    [facility]  [phone]    [facility]  [phone]    [facility]  [phone]    [facility]  [phone]

## 2016-06-20 NOTE — ED Notes (Signed)
Patient provided a urine sample yellow clear in color.

## 2016-06-30 ENCOUNTER — Emergency Department (HOSPITAL_COMMUNITY)
Admission: EM | Admit: 2016-06-30 | Discharge: 2016-06-30 | Disposition: A | Discharge status: Home / Self Care | Payer: Medicaid Other | Attending: Emergency Medicine | Admitting: Emergency Medicine

## 2016-06-30 ENCOUNTER — Emergency Department (HOSPITAL_COMMUNITY): Payer: Medicaid Other

## 2016-06-30 ENCOUNTER — Encounter (HOSPITAL_COMMUNITY): Payer: Self-pay

## 2016-06-30 DIAGNOSIS — W230XXA Caught, crushed, jammed, or pinched between moving objects, initial encounter: Secondary | ICD-10-CM | POA: Diagnosis not present

## 2016-06-30 DIAGNOSIS — Z793 Long term (current) use of hormonal contraceptives: Secondary | ICD-10-CM | POA: Insufficient documentation

## 2016-06-30 DIAGNOSIS — Y939 Activity, unspecified: Secondary | ICD-10-CM | POA: Insufficient documentation

## 2016-06-30 DIAGNOSIS — S6992XA Unspecified injury of left wrist, hand and finger(s), initial encounter: Secondary | ICD-10-CM | POA: Diagnosis present

## 2016-06-30 DIAGNOSIS — Y929 Unspecified place or not applicable: Secondary | ICD-10-CM | POA: Diagnosis not present

## 2016-06-30 DIAGNOSIS — Y999 Unspecified external cause status: Secondary | ICD-10-CM | POA: Diagnosis not present

## 2016-06-30 DIAGNOSIS — S6000XA Contusion of unspecified finger without damage to nail, initial encounter: Secondary | ICD-10-CM

## 2016-06-30 DIAGNOSIS — Z23 Encounter for immunization: Secondary | ICD-10-CM | POA: Diagnosis not present

## 2016-06-30 DIAGNOSIS — S60022A Contusion of left index finger without damage to nail, initial encounter: Secondary | ICD-10-CM | POA: Diagnosis not present

## 2016-06-30 HISTORY — DX: Hypokalemia: E87.6

## 2016-06-30 MED ORDER — TETANUS-DIPHTH-ACELL PERTUSSIS 5-2.5-18.5 LF-MCG/0.5 IM SUSP
0.5000 mL | Freq: Once | INTRAMUSCULAR | Status: AC
  Administered 2016-06-30: 0.5 mL via INTRAMUSCULAR
  Filled 2016-06-30: qty 0.5

## 2016-06-30 MED ORDER — IBUPROFEN 200 MG PO TABS
600.0000 mg | ORAL_TABLET | Freq: Once | ORAL | Status: AC
  Administered 2016-06-30: 600 mg via ORAL
  Filled 2016-06-30: qty 3

## 2016-06-30 NOTE — ED Notes (Signed)
Bed: WTR9 Expected date:  Expected time:  Means of arrival:  Comments: Lac to finger

## 2016-06-30 NOTE — ED Notes (Signed)
PT VERY PREOCCUPIED WITH BLOOD. PT STS, "I NEED A COTTON BALL, I'M BLEEDING OUT OF MY ARM WHERE YOU GAVE ME THAT SHOT!" WHEN THIS WRITER ASSESSED HER ARM, THERE WAS NO BLOOD ON THE BAND-AID. THE PT KEPT SCREAMING, "I'M BLEEDING OUT, I NEED A COTTON BALL!" A GAUZE WAS PLACED ON HER ARM, THEN THE BAND-AID WAS REAPPLIED. THE PT SNATCHED HER WORK NOTE FROM THIS WRITER'S HAND AND SAID, "YOU STUPID BITCH, I TOLD YOU I WAS BLEEDING OUT!" THE PT LEFT THROUGH THE LOBBY, THEN RETURNED AFTER REMOVING THE BAND-AID, STS, "I NEED ANOTHER COTTON BALL, I'M BLEEDING OUT!" SECURITY GAVE HER ANOTHER BAND-AID AND GAUZE AND ASKED HER TO LEAVE THE PREMISES.

## 2016-06-30 NOTE — Discharge Instructions (Signed)
Please read instructions below. Apply ice to your finger for 15-20 minutes at a time. You can take advil every 6 hours as needed for pain. Schedule an appointment with your PCP as needed if sx don't improve in 1 week. Return to the ER for new or concerning symptoms.

## 2016-06-30 NOTE — ED Provider Notes (Signed)
[facility]-EMERGENCY DEPT Provider Note   CSN: [id number] Arrival date & time: 06/30/16  1420  By signing my name below, I, Paschal Dopp, attest that this documentation has been prepared under the direction and in the presence of [place] Russo, PA-C.  Electronically Signed: Paschal Dopp, Scribe. 06/30/2016. 4:11 PM.  History   Chief Complaint Chief Complaint  Patient presents with  . Laceration    HPI Sara Arroyo is a 26 y.o. female who presents to the Emergency Department complaining of left index finger laceration onset today. Pt reports accidentally slamming her left index finger against car door and notes that she opened the car door to remove her left index finger. Pt left index finger is worsened with movement. Pt reports associated left index finger pain and left index finger swelling. She rates her left index finger pain as 7-8/10 and a throbbing sensation. Pt applied pressure with a paper towel. Has not taken any medications. Denies previous injury to her left index finger. She reports syncopal episode after lacerating left finger due to anxiety, without head trauma, lasting a few seconds.  Pt denies nausea, HA, vomiting, vision change, and any other symptoms. Denies PMHx of gastric ulcers and GI bleeding. Pt does not have a PCP.   The history is provided by the patient. No language interpreter was used.    Past Medical History:  Diagnosis Date  . Abnormal Pap smear   . Hypokalemia     Patient Active Problem List   Diagnosis Date Noted  . Concern about STD in female without diagnosis 11/04/2014  . Encounter for Depo-Provera contraception 09/03/2012  . DUB (dysfunctional uterine bleeding) 09/03/2012  . Weight loss, non-intentional 08/30/2011    Past Surgical History:  Procedure Laterality Date  . COLPOSCOPY  10/2011    OB History    Gravida Para Term Preterm AB Living   2 1 1   1 1    SAB TAB Ectopic Multiple Live Births     1             Home Medications     Prior to Admission medications   Medication Sig Start Date End Date Taking? Authorizing Provider  amoxicillin-clavulanate (AUGMENTIN) 875-125 MG tablet Take 1 tablet by mouth 2 (two) times daily. 11/14/14   Trixie Dredge, PA-C  Diphenhydramine-APAP (TYLENOL COLD RELIEF NIGHTTIME) 25-500 MG/15ML LIQD Take 30 mLs by mouth every 4 (four) hours as needed (cold symptoms).    [provider]  medroxyPROGESTERone (DEPO-PROVERA) 150 MG/ML injection Inject 150 mg into the muscle every 3 (three) months.    [provider]  metroNIDAZOLE (FLAGYL) 500 MG tablet Take 1 tablet (500 mg total) by mouth 2 (two) times daily. 11/08/14   Adam Phenix, MD  ondansetron (ZOFRAN) 4 MG tablet Take 1 tablet (4 mg total) by mouth every 8 (eight) hours as needed for nausea or vomiting. 11/14/14   Trixie Dredge, PA-C  pseudoephedrine (SUDAFED) 60 MG tablet Take 120 mg by mouth once.    [provider]    Family History Family History  Problem Relation Age of Onset  . Cancer Father     Social History Social History  Substance Use Topics  . Smoking status: Never Smoker  . Smokeless tobacco: Never Used  . Alcohol use No     Allergies   Patient has no known allergies.   Review of Systems Review of Systems  Eyes: Negative for visual disturbance.  Gastrointestinal: Negative for nausea and vomiting.  Musculoskeletal: Positive for  arthralgias (left index finger) and joint swelling (left index finger).  Skin: Positive for wound (laceration to left index finger).  Neurological: Negative for syncope and headaches.     Physical Exam Updated Vital Signs BP (!) 105/55 (BP Location: Right Arm)   Pulse 88   Temp 97.8 F (36.6 C) (Oral)   Resp 20   Ht 5' (1.524 m)   Wt 99 lb (44.9 kg)   SpO2 100%   BMI 19.33 kg/m   Physical Exam  Constitutional: She is oriented to person, place, and time. She appears well-developed and well-nourished.  HENT:  Head: Normocephalic and atraumatic.    Eyes: Conjunctivae and EOM are normal. Pupils are equal, round, and reactive to light.  Neck: Normal range of motion. Neck supple.  Cardiovascular: Normal rate and intact distal pulses.   Pulmonary/Chest: Effort normal.  Musculoskeletal:  Left index finger with mild edema. Superficial laceration to the distal pad measuring 1 cm long. TTP to DIP joint. Normal ROM of finger. No nail involvement.  No gross deformities.  Neurological: She is alert and oriented to person, place, and time. She displays normal reflexes. No cranial nerve deficit or sensory deficit. She exhibits normal muscle tone. Coordination normal.  5/5 strength /l UE and LE. Normal gait  Psychiatric: She has a normal mood and affect. Her behavior is normal.  Nursing note and vitals reviewed.    ED Treatments / Results  DIAGNOSTIC STUDIES:  Oxygen Saturation is 100% on  RA, normal by my interpretation.    COORDINATION OF CARE:  4:06 PM Discussed treatment plan with pt at bedside and pt agreed to plan.  Labs (all labs ordered are listed, but only abnormal results are displayed) Labs Reviewed - No data to display  EKG  EKG Interpretation None       Radiology Dg Finger Index Left  Result Date: 06/30/2016 CLINICAL DATA:  26 year old female with injury to the left index finger in a car door. Pain and swelling. EXAM: LEFT INDEX FINGER 2+V COMPARISON:  No priors. FINDINGS: There is no evidence of fracture or dislocation. There is no evidence of arthropathy or other focal bone abnormality. Soft tissues are unremarkable. IMPRESSION: Negative. Electronically Signed   By: Trudie Reedaniel  Entrikin M.D.   On: 06/30/2016 14:57    Procedures Procedures (including critical care time)  Medications Ordered in ED Medications  ibuprofen (ADVIL,MOTRIN) tablet 600 mg (600 mg Oral Given 06/30/16 1629)  Tdap (BOOSTRIX) injection 0.5 mL (0.5 mLs Intramuscular Given 06/30/16 1629)     Initial Impression / Assessment and Plan / ED Course   I have reviewed the triage vital signs and the nursing notes.  Pertinent labs & imaging results that were available during my care of the patient were reviewed by me and considered in my medical decision making (see chart for details).     Pt w superficial laceration to distal 1st left finger. Laceration superficial, sutures not indicated at this time. Tetanus updated in ED. Wound irrigated and bandaged. Discussed wound care with pt and answered questions. Pt to f-u for wound check should there be signs of dehiscence or infection. Pt is hemodynamically stable with no complaints prior to dc.    Discussed results, findings, treatment and follow up. Patient advised of return precautions. Patient verbalized understanding and agreed with plan.   Final Clinical Impressions(s) / ED Diagnoses   Final diagnoses:  Contusion of finger of left hand, initial encounter    New Prescriptions Discharge Medication List as of 06/30/2016  4:13 PM    I personally performed the services described in this documentation, which was scribed in my presence. The recorded information has been reviewed and is accurate.    Russo, [place] N, PA-C 06/30/16 1638    Melene Plan, DO 06/30/16 1649

## 2016-06-30 NOTE — ED Triage Notes (Signed)
PT RECEIVED VIA EMS C/O A LACERATION TO THE LEFT INDEX FINGER. PER EMS, THE PT SLAMMED HER FINGER IN THE CAR DOOR. MINIMAL BLEEDING. PT VERY ANXIOUS AND STS THAT HER FINGER IS NUMB AND IT WON'T STOP BLEEDING. PT STS SHE IS ALSO UNABLE TO THE BEND THE FINGER.

## 2016-06-30 NOTE — ED Notes (Signed)
PT DISCHARGED. INSTRUCTIONS GIVEN. AAOX4. PT IN NO APPARENT DISTRESS WITH MILD PAIN. THE OPPORTUNITY TO ASK QUESTIONS WAS PROVIDED. 

## 2017-07-29 ENCOUNTER — Emergency Department (HOSPITAL_COMMUNITY)
Admission: EM | Admit: 2017-07-29 | Discharge: 2017-07-29 | Disposition: A | Discharge status: Home / Self Care | Payer: Medicaid Other

## 2017-07-29 NOTE — ED Notes (Signed)
Bed: [id number] Expected date:  Expected time:  Means of arrival:  Comments: abd pain, fentanyl given

## 2017-07-29 NOTE — ED Triage Notes (Signed)
EMS reports homeless, c/o left flank pain, office staff of [place] bldg called EMS as Pt was sitting on foyer floor.  BP 99/60 HR 102 Resp 18 Sp02 99 RA Pt refused CBG

## 2017-08-04 ENCOUNTER — Encounter (HOSPITAL_COMMUNITY): Payer: Self-pay

## 2017-08-04 ENCOUNTER — Emergency Department (HOSPITAL_COMMUNITY)
Admission: EM | Admit: 2017-08-04 | Discharge: 2017-08-04 | Disposition: A | Discharge status: Home / Self Care | Payer: Medicaid Other | Attending: Emergency Medicine | Admitting: Emergency Medicine

## 2017-08-04 ENCOUNTER — Other Ambulatory Visit: Payer: Self-pay

## 2017-08-04 ENCOUNTER — Emergency Department (HOSPITAL_COMMUNITY): Payer: Medicaid Other

## 2017-08-04 DIAGNOSIS — M542 Cervicalgia: Secondary | ICD-10-CM | POA: Diagnosis not present

## 2017-08-04 DIAGNOSIS — M549 Dorsalgia, unspecified: Secondary | ICD-10-CM | POA: Diagnosis not present

## 2017-08-04 DIAGNOSIS — M5489 Other dorsalgia: Secondary | ICD-10-CM | POA: Diagnosis not present

## 2017-08-04 DIAGNOSIS — M546 Pain in thoracic spine: Secondary | ICD-10-CM | POA: Diagnosis not present

## 2017-08-04 DIAGNOSIS — R9431 Abnormal electrocardiogram [ECG] [EKG]: Secondary | ICD-10-CM | POA: Diagnosis not present

## 2017-08-04 DIAGNOSIS — S299XXA Unspecified injury of thorax, initial encounter: Secondary | ICD-10-CM | POA: Diagnosis not present

## 2017-08-04 DIAGNOSIS — M545 Low back pain: Secondary | ICD-10-CM | POA: Diagnosis not present

## 2017-08-04 LAB — CBC WITH DIFFERENTIAL/PLATELET
BASOS ABS: 0 10*3/uL (ref 0.0–0.1)
Basophils Relative: 0 %
Eosinophils Absolute: 0 10*3/uL (ref 0.0–0.7)
Eosinophils Relative: 0 %
HCT: 42.5 % (ref 36.0–46.0)
HEMOGLOBIN: 14.6 g/dL (ref 12.0–15.0)
LYMPHS ABS: 2.6 10*3/uL (ref 0.7–4.0)
LYMPHS PCT: 39 %
MCH: 32.2 pg (ref 26.0–34.0)
MCHC: 34.4 g/dL (ref 30.0–36.0)
MCV: 93.6 fL (ref 78.0–100.0)
Monocytes Absolute: 0.3 10*3/uL (ref 0.1–1.0)
Monocytes Relative: 5 %
NEUTROS PCT: 56 %
Neutro Abs: 3.8 10*3/uL (ref 1.7–7.7)
PLATELETS: 222 10*3/uL (ref 150–400)
RBC: 4.54 MIL/uL (ref 3.87–5.11)
RDW: 12.4 % (ref 11.5–15.5)
WBC: 6.7 10*3/uL (ref 4.0–10.5)

## 2017-08-04 LAB — COMPREHENSIVE METABOLIC PANEL
ALK PHOS: 49 U/L (ref 38–126)
ALT: 14 U/L (ref 0–44)
AST: 21 U/L (ref 15–41)
Albumin: 4 g/dL (ref 3.5–5.0)
Anion gap: 11 (ref 5–15)
BUN: 11 mg/dL (ref 6–20)
CALCIUM: 9 mg/dL (ref 8.9–10.3)
CO2: 21 mmol/L — AB (ref 22–32)
CREATININE: 0.88 mg/dL (ref 0.44–1.00)
Chloride: 106 mmol/L (ref 98–111)
Glucose, Bld: 77 mg/dL (ref 70–99)
Potassium: 3.5 mmol/L (ref 3.5–5.1)
SODIUM: 138 mmol/L (ref 135–145)
Total Bilirubin: 1.4 mg/dL — ABNORMAL HIGH (ref 0.3–1.2)
Total Protein: 6.8 g/dL (ref 6.5–8.1)

## 2017-08-04 LAB — URINALYSIS, ROUTINE W REFLEX MICROSCOPIC
BILIRUBIN URINE: NEGATIVE
Bacteria, UA: NONE SEEN
Glucose, UA: NEGATIVE mg/dL
Ketones, ur: 20 mg/dL — AB
LEUKOCYTES UA: NEGATIVE
NITRITE: NEGATIVE
Protein, ur: NEGATIVE mg/dL
SPECIFIC GRAVITY, URINE: 1.02 (ref 1.005–1.030)
pH: 6 (ref 5.0–8.0)

## 2017-08-04 LAB — RAPID URINE DRUG SCREEN, HOSP PERFORMED
AMPHETAMINES: NOT DETECTED
BENZODIAZEPINES: NOT DETECTED
Cocaine: NOT DETECTED
OPIATES: NOT DETECTED
TETRAHYDROCANNABINOL: POSITIVE — AB

## 2017-08-04 LAB — I-STAT CHEM 8, ED
BUN: 10 mg/dL (ref 6–20)
CHLORIDE: 104 mmol/L (ref 98–111)
Calcium, Ion: 1.18 mmol/L (ref 1.15–1.40)
Creatinine, Ser: 0.8 mg/dL (ref 0.44–1.00)
Glucose, Bld: 72 mg/dL (ref 70–99)
HCT: 44 % (ref 36.0–46.0)
Hemoglobin: 15 g/dL (ref 12.0–15.0)
POTASSIUM: 3.4 mmol/L — AB (ref 3.5–5.1)
Sodium: 141 mmol/L (ref 135–145)
TCO2: 23 mmol/L (ref 22–32)

## 2017-08-04 LAB — I-STAT BETA HCG BLOOD, ED (MC, WL, AP ONLY): I-stat hCG, quantitative: <5 m[IU]/mL (ref <5)

## 2017-08-04 LAB — POC URINE PREG, ED: PREG TEST UR: NEGATIVE

## 2017-08-04 LAB — LIPASE, BLOOD: Lipase: 30 U/L (ref 11–51)

## 2017-08-04 LAB — I-STAT TROPONIN, ED: Troponin i, poc: 0 ng/mL (ref 0.00–0.08)

## 2017-08-04 MED ORDER — METHOCARBAMOL 500 MG PO TABS
750.0000 mg | ORAL_TABLET | Freq: Once | ORAL | Status: AC
  Administered 2017-08-04: 750 mg via ORAL
  Filled 2017-08-04: qty 2

## 2017-08-04 MED ORDER — LORAZEPAM 1 MG PO TABS: 1.0000 mg | ORAL_TABLET | Freq: Once | ORAL | Status: DC

## 2017-08-04 MED ORDER — LORAZEPAM 2 MG/ML IJ SOLN
1.0000 mg | Freq: Once | INTRAMUSCULAR | Status: AC
  Administered 2017-08-04: 1 mg via INTRAVENOUS
  Filled 2017-08-04: qty 1

## 2017-08-04 MED ORDER — METHOCARBAMOL 500 MG PO TABS: 500.0000 mg | ORAL_TABLET | Freq: Two times a day (BID) | ORAL | 0 refills | Status: DC

## 2017-08-04 MED ORDER — MELOXICAM 15 MG PO TABS: 15.0000 mg | ORAL_TABLET | Freq: Every day | ORAL | 0 refills | Status: DC

## 2017-08-04 MED ORDER — LIDOCAINE 5 % EX PTCH: 1.0000 | MEDICATED_PATCH | CUTANEOUS | 0 refills | Status: DC

## 2017-08-04 MED ORDER — MORPHINE SULFATE (PF) 4 MG/ML IV SOLN: 4.0000 mg | Freq: Once | INTRAVENOUS | Status: DC

## 2017-08-04 MED ORDER — MORPHINE SULFATE (PF) 2 MG/ML IV SOLN
INTRAVENOUS | Status: AC
  Administered 2017-08-04: 4 mg via INTRAVENOUS
  Filled 2017-08-04: qty 2

## 2017-08-04 MED ORDER — FENTANYL CITRATE (PF) 100 MCG/2ML IJ SOLN: 50.0000 ug | Freq: Once | INTRAMUSCULAR | Status: DC

## 2017-08-04 NOTE — ED Notes (Signed)
Pt refused to get up to bed. Pt reports "I cannot walk! That is what I am here for." Pt refused to attempt to transfer from chair to bed.  RN Leeroy Bockhelsea and this Clinical research associatewriter physically moved pt to the bed.

## 2017-08-04 NOTE — Discharge Instructions (Addendum)
Your workup was reassuring. Your xrays were normal. See attached handouts.  Follow-up with orthopedics if your symptoms do not improve in 1 week.  You can also talk to your primary care provider about getting you into physical therapy. Take medication as prescribed.  Do not take medication if you think you may be pregnant.  Get help right away if: You develop new bowel or bladder control problems. You have unusual weakness or numbness in your arms or legs. You develop nausea or vomiting. You develop abdominal pain. You feel faint.

## 2017-08-04 NOTE — ED Notes (Signed)
Discharge instructions reviewed with patient. Patient verbalizes understanding. VSS.  Patient ambulated out of ER without assistance

## 2017-08-04 NOTE — ED Notes (Signed)
Patient assisted to the restroom via Toniann KetSara Steady. Patient stood and sat on the steady using her right leg primarily. Patient reports not wanting to put weight on her left leg due to "my left side back hurting so much." Patient provided clean catch urine sample and was assisted back to bed via sara steady.

## 2017-08-04 NOTE — ED Provider Notes (Signed)
[facility]-EMERGENCY DEPT Provider Note   CSN: [id number] Arrival date & time: 08/04/17  1036     History   Chief Complaint Chief Complaint  Patient presents with  . Back Pain    HPI Sara JAHR is a 27 y.o. female with no significant past medical history presents emergency department today for back pain.  Patient notes that she has a history of chronic back pain secondary to a prior accident.  On chart review it appears that patient was seen for similar back pain on the right side several months ago.  Patient reports that she woke this morning with back pain on the left side from her neck to her lower back and down her leg.  She notes when she was at work she lifted a heavy can of tea and had the sudden onset of a tearing pain on her left shoulder blade down to her left lower back.  She now notes that pain is so severe that she cannot move.  She is curled up on the bed and is refusing to walk for staff or move.  Patient is moving all 4 extremities without difficulty.  The patient now feels like the pain wraps around and goes into the left side of her chest. She denies any interventions prior to arrival.  Patient denies any headache, visual changes, trauma, falls, history of cancer, trauma, fever, night pain, IV drug use, recent spinal manipulation or procedures, upper back pain or neck pain, numbness/tingling/weakness of the upper or lower extremities, urinary retention, loss of bowel/bladder function, saddle anesthesia, unexplained weight loss, history of kidney stones, a hematuria, dysuria, urinary frequency, urinary urgency, abdominal pain.   HPI  Past Medical History:  Diagnosis Date  . Abnormal Pap smear   . Hypokalemia     Patient Active Problem List   Diagnosis Date Noted  . Concern about STD in female without diagnosis 11/04/2014  . Encounter for Depo-Provera contraception 09/03/2012  . DUB (dysfunctional uterine bleeding) 09/03/2012  . Weight loss,  non-intentional 08/30/2011    Past Surgical History:  Procedure Laterality Date  . COLPOSCOPY  10/2011     OB History    Gravida  2   Para  1   Term  1   Preterm      AB  1   Living  1     SAB      TAB  1   Ectopic      Multiple      Live Births               Home Medications    Prior to Admission medications   Medication Sig Start Date End Date Taking? Authorizing Provider  amoxicillin-clavulanate (AUGMENTIN) 875-125 MG tablet Take 1 tablet by mouth 2 (two) times daily. Patient not taking: Reported on 08/04/2017 11/14/14   Trixie Dredge, PA-C  metroNIDAZOLE (FLAGYL) 500 MG tablet Take 1 tablet (500 mg total) by mouth 2 (two) times daily. Patient not taking: Reported on 08/04/2017 11/08/14   Adam Phenix, MD  ondansetron (ZOFRAN) 4 MG tablet Take 1 tablet (4 mg total) by mouth every 8 (eight) hours as needed for nausea or vomiting. Patient not taking: Reported on 08/04/2017 11/14/14   Trixie Dredge, PA-C    Family History Family History  Problem Relation Age of Onset  . Cancer Father     Social History Social History   Tobacco Use  . Smoking status: Never Smoker  . Smokeless tobacco: Never  Used  Substance Use Topics  . Alcohol use: No  . Drug use: No     Allergies   Patient has no known allergies.   Review of Systems Review of Systems  All other systems reviewed and are negative.    Physical Exam Updated Vital Signs BP 109/61 (BP Location: Right Arm)   Pulse 72   Temp 98 F (36.7 C) (Oral)   Resp 18   Ht 4\' 9"  (1.448 m)   Wt 45.8 kg (101 lb)   LMP 08/04/2017   SpO2 100%   BMI 21.86 kg/m   Physical Exam  Constitutional: She appears well-developed and well-nourished. No distress.  Patient lying on left side. Resisting movement. She yells if you attempt to lie her on her back.   HENT:  Head: Normocephalic and atraumatic.  Right Ear: External ear normal.  Left Ear: External ear normal.  Neck: Normal range of motion. Neck supple. No  spinous process tenderness present. No neck rigidity. Normal range of motion present.  Cardiovascular: Normal rate, regular rhythm, normal heart sounds and intact distal pulses.  No murmur heard. Pulses:      Radial pulses are 2+ on the right side, and 2+ on the left side.       Femoral pulses are 2+ on the right side, and 2+ on the left side.      Dorsalis pedis pulses are 2+ on the right side, and 2+ on the left side.       Posterior tibial pulses are 2+ on the right side, and 2+ on the left side.  Pulmonary/Chest: Effort normal and breath sounds normal. No respiratory distress.  Abdominal: Soft. Bowel sounds are normal. She exhibits no pulsatile midline mass. There is no tenderness. There is no rigidity, no rebound and no CVA tenderness.  Musculoskeletal:  Patient lying on right side. No evidence of obvious scoliosis or kyphosis. No obvious signs of skin changes, trauma, deformity, infection. No C, T, or L spine tenderness or step-offs to palpation.  Left-sided paraspinal muscle of the cervical, thoracic and lumbar spine.  Lung expansion normal. Bilateral upper and lower extremity strength 5 out of 5 including extensor hallucis longus. Patellar and Achilles deep tendon reflex 2+ and equal bilaterally. Sensation of upper and lower extremities grossly intact. Upper and Lower extremity compartments soft. PT and DP 2+ b/l. Cap refill <2 seconds.   Neurological: She is alert.  No foot drop. Speech clear. Follows commands. No facial droop. PERRLA. EOMI. Normal peripheral fields. CN III-XII intact.  Grossly moves all extremities 4 without ataxia. Coordination intact. Able and appropriate strength for age to upper and lower extremities bilaterally. Sensation to light touch intact bilaterally for upper and lower. Patellar deep tendon reflex 2+ and equal bilaterally. Normal finger to nose. Refuses to demonstrate gait.   Skin: Skin is warm, dry and intact. Capillary refill takes less than 2 seconds. No rash  noted. She is not diaphoretic. No erythema.  No vesicular-like rash noted.  Nursing note and vitals reviewed.    ED Treatments / Results  Labs (all labs ordered are listed, but only abnormal results are displayed) Labs Reviewed - No data to display  EKG None  Radiology No results found.  Procedures Procedures (including critical care time)  Medications Ordered in ED Medications  fentaNYL (SUBLIMAZE) injection 50 mcg (has no administration in time range)  methocarbamol (ROBAXIN) tablet 750 mg (has no administration in time range)     Initial Impression / Assessment and Plan /  ED Course  I have reviewed the triage vital signs and the nursing notes.  Pertinent labs & imaging results that were available during my care of the patient were reviewed by me and considered in my medical decision making (see chart for details).     27 y.o. female presenting with left-sided back pain that appears to be acute on chronic.  Patient reports she was carrying a heavy container of tea when the pain began.  Patient notes that she has pain from the left side of her posterior back from top to bottom.  She denies any neurologic symptoms.  Patient without any focal neurologic deficits.  Pulses are equal and symmetric for upper and lower.  No bowel or bladder incontinence.  No urinary retention or saddle anesthesia.  No concern for cauda equina.  No history of trauma, personal history of cancer, night sweats or weight loss that would warrant a x-ray at this time.  No spinal injections, fever or IV drug use that make me concerned for spinal hematoma or abscess.  No pulsatile mass of the abdomen.  No abdominal tenderness to palpation or guarding to make me concern for intra-abdominal pathology.  No urinary symptoms or CVA tenderness to make me concerned for cystitis versus pyelonephritis versus kidney stone.  Suspect patient's symptoms are musculoskeletal in nature however  patient refuses to walk.  She is  lying on her right side and screaming at staff.  She states she will not move and that she has worked up.  She is requesting blood work as well as x-rays.  She refuses to walk or turn onto her back until she receives further work-up. Explained to patient I do not think she needs xrays. She is refusing to stand or walk for myself or nursing staff until she gets xrays and blood work. She is verbally abuse and aggressive towards staff members.   The work-up is unremarkable.  After patient hears this, she is now sitting up in bed moving around and walking without difficulty.  The evaluation does not show pathology that would require ongoing emergent intervention or inpatient treatment. Will treat the patient with back exercises, activity modification, muscle relaxers, NSAIDs (no history of kidney disease or GI bleed). Note given for work.  I advised the patient to follow-up with PCP vs orthopedics this week. I advised the patient to return to the emergency department with new or worsening symptoms or new concerns. Specific return precautions discussed. The patient verbalized understanding and agreement with plan. All questions answered. No further questions at this time. The patient is hemodynamically stable, mentating appropriately and appears safe for discharge.   Final Clinical Impressions(s) / ED Diagnoses   Final diagnoses:  Acute left-sided back pain, unspecified back location    ED Discharge Orders    None       Princella PellegriniMaczis, Michael M, PA-C 08/04/17 1622    Lorre NickAllen, Anthony, MD 08/05/17 [phone]

## 2017-08-04 NOTE — ED Triage Notes (Signed)
Per EMS- Patient was at work and lifted a large container of tea. Patient felt pain from the left shoulder to the left lower back. patient rates pain 10/10. No urinary symptoms.

## 2017-08-15 DIAGNOSIS — M546 Pain in thoracic spine: Secondary | ICD-10-CM | POA: Diagnosis not present

## 2017-09-09 ENCOUNTER — Ambulatory Visit: Payer: Medicaid Other | Admitting: Physical Therapy

## 2017-09-10 ENCOUNTER — Other Ambulatory Visit: Payer: Self-pay

## 2017-09-10 ENCOUNTER — Ambulatory Visit: Payer: Medicaid Other | Attending: Family Medicine | Admitting: Physical Therapy

## 2017-09-10 ENCOUNTER — Encounter: Payer: Self-pay | Admitting: Physical Therapy

## 2017-09-10 DIAGNOSIS — M62830 Muscle spasm of back: Secondary | ICD-10-CM

## 2017-09-10 DIAGNOSIS — R293 Abnormal posture: Secondary | ICD-10-CM | POA: Diagnosis not present

## 2017-09-10 DIAGNOSIS — M6281 Muscle weakness (generalized): Secondary | ICD-10-CM | POA: Diagnosis not present

## 2017-09-10 NOTE — Therapy (Addendum)
[facility] [address] [place], [place], [address] Phone: [phone]   Fax:  [phone]  Physical Therapy Evaluation  Patient Details  Name: Sara Arroyo MRN: [id number] Date of Birth: 1990/04/07 Referring Provider: Jerrell BelfastJackson, Chelsea, FNP   Encounter Date: 09/10/2017  PT End of Session - 09/10/17 1305    Visit Number  1    Number of Visits  13    Date for PT Re-Evaluation  10/22/17    Authorization Type  MCD (resubmit at 4th visit)    PT Start Time  1017    PT Stop Time  1102    PT Time Calculation (min)  45 min    Activity Tolerance  Patient tolerated treatment well    Behavior During Therapy  [employer]FL for tasks assessed/performed       Past Medical History:  Diagnosis Date  . Abnormal Pap smear   . Hypokalemia     Past Surgical History:  Procedure Laterality Date  . COLPOSCOPY  10/2011    There were no vitals filed for this visit.   Subjective Assessment - 09/10/17 1026    Subjective  Pt reports she is having L sided back pain. The injury happened in the beginning of July at work. She had some pain that morning and she lifted a box of tea. She fell to the ground and was transported to the hospital. Most of her pain is located around her L shoulder blade. She has trouble sleeping due to the pain.     Limitations  Sitting;Lifting;Standing;Walking    How long can you sit comfortably?  15 min    How long can you stand comfortably?  1 hour    How long can you walk comfortably?  <5 min    Diagnostic tests  x-rays- negative    Patient Stated Goals  working without pain    Currently in Pain?  Yes    Pain Score  1     Pain Location  Back    Pain Orientation  Left;Mid;Lower;Upper    Pain Descriptors / Indicators  Stabbing    Pain Type  Acute pain    Pain Radiating Towards  none    Pain Onset  1 to 4 weeks ago    Pain Frequency  Intermittent    Aggravating Factors   sitting, bending head forward, stretching    Pain  Relieving Factors  warm showers         [facility] PT Assessment - 09/10/17 0001      Assessment   Medical Diagnosis  upper back pain    Referring Provider  Jerrell BelfastJackson, Chelsea, FNP    Onset Date/Surgical Date  -- beginning of Julky    Hand Dominance  Right    Next MD Visit  -- none    Prior Therapy  yes      Precautions   Precautions  None      Restrictions   Weight Bearing Restrictions  No      Balance Screen   Has the patient fallen in the past 6 months  Yes    How many times?  1    Has the patient had a decrease in activity level because of a fear of falling?   Yes    Is the patient reluctant to leave their home because of a fear of falling?   No      Home Public house managernvironment   Living Environment  Private residence    Living Arrangements  --  grandmother    Type of Home  House    Home Access  Stairs to enter    Entrance Stairs-Number of Steps  3    Entrance Stairs-Rails  None    Home Layout  Multi-level    Alternate Level Stairs-Number of Steps  25    Alternate Level Stairs-Rails  Right    Home Equipment  None      Prior Function   Level of Independence  Needs assistance with homemaking    Vocation  Part time employment    Vocation Requirements  lifting    Leisure  working, daughter      Cognition   Overall Cognitive Status  Within Functional Limits for tasks assessed      Observation/Other Assessments   Oswestry Disability Index   attempted Oswestry but pt did not follow directions for correct scoring      Posture/Postural Control   Posture/Postural Control  Postural limitations    Postural Limitations  Rounded Shoulders;Forward head      ROM / Strength   AROM / PROM / Strength  AROM;Strength      AROM   AROM Assessment Site  Lumbar;Thoracic    Right/Left Shoulder  Right;Left    Right Shoulder Flexion  161 Degrees    Right Shoulder ABduction  161 Degrees    Right Shoulder Internal Rotation  -- T7    Right Shoulder External Rotation  -- T6    Left Shoulder Flexion   141 Degrees    Left Shoulder ABduction  145 Degrees    Left Shoulder Internal Rotation  -- T10    Left Shoulder External Rotation  -- T4    Lumbar Flexion  70 painful    Lumbar Extension  20    Lumbar - Right Side Bend  20    Lumbar - Left Side Bend  30 stiff      Strength   Strength Assessment Site  Shoulder    Right/Left Shoulder  Right;Left    Right Shoulder Flexion  4+/5    Right Shoulder Extension  4+/5    Right Shoulder ABduction  4+/5    Right Shoulder Internal Rotation  4+/5    Left Shoulder Flexion  3+/5    Left Shoulder Extension  4-/5    Left Shoulder ABduction  4-/5    Left Shoulder Internal Rotation  4-/5      Palpation   Palpation comment  ttp teres major and minor                Objective measurements completed on examination: See above findings.              PT Education - 09/10/17 1304    Education Details  Examination findings, POC, HEP, educated pt on need to speak with supervisor if she wishes to or is able to pursue workers compensation, muscle spasm/strain    Person(s) Educated  Patient    Methods  Explanation;Handout    Comprehension  Verbalized understanding       PT Short Term Goals - 09/10/17 1313      PT SHORT TERM GOAL #1   Title  Pt will be I with HEP    Baseline  issued HEP today    Time  3    Period  Weeks    Status  New    Target Date  10/01/17      PT SHORT TERM GOAL #2   Title  Pt will demonstrate/verbalize proper posture  to reduce pain to </= 2/10 and promote efficient lifting mechanics    Baseline  forward head, rounded shoulders    Time  3    Period  Weeks    Status  New    Target Date  10/01/17        PT Long Term Goals - 09/10/17 1315      PT LONG TERM GOAL #1   Title  Pt will increase L shoulder AROM to [facility] with </= 2/10 pain in order to improve functional mobility and aid in ability to perform work related duties    Baseline  flexion 141 degrees, abduction 145 degrees, IR T10, ER T4    Time  6     Period  Weeks    Status  New    Target Date  10/22/17      PT LONG TERM GOAL #2   Title  Pt will increase L shoulder strength to >/= 4+5 to improve mobility and aid in ability to perform work related duties    Baseline  flex 3+, abd 4-, ext 4-, IR 4-    Time  6    Period  Weeks    Status  New    Target Date  10/22/17      PT LONG TERM GOAL #3   Title  Pt will be able to push, pull, and lift >/= 25 lbs with proper form to perform work related duties with </=2/10 pain    Baseline  pt unable to push, pull, or lift without pain    Time  6    Period  Weeks    Status  New    Target Date  10/22/17      PT LONG TERM GOAL #4   Title  Pt will be I with HEP by final visit to ensure progress beyond PT discharge    Baseline  issued HEP today    Time  6    Period  Weeks    Status  New    Target Date  10/22/17             Plan - 09/10/17 1023    Clinical Impression Statement  Pt is a 27 yo female referred to OPPT for upper back pain. Pt reports it is her entire back on her L side. She injured her back when lifting a jug of tea at her job. Upon evaluation, pt presents with increased tenderness over the teres major and minor. She is limited in L shoulder flexion, abduction, IR, and ER. She also has limited strength in her L shoulder. Lumbar AROM within function limits. She is unable to complete all of her job duties due to this injury. Based on assessment, pt would benefit from OPPT services to address pain, ROM, strength, and functional mobility.    Clinical Presentation  Stable    Clinical Presentation due to:  pain, dec strength and ROM, MOI    Clinical Decision Making  Low    Rehab Potential  Good    PT Frequency  2x / week    PT Duration  6 weeks    PT Treatment/Interventions  ADLs/Self Care Home Management;Electrical Stimulation;Cryotherapy;Iontophoresis 4mg /ml Dexamethasone;Moist Heat;Ultrasound;Gait training;Stair training;Functional mobility training;Therapeutic  activities;Therapeutic exercise;Balance training;Neuromuscular re-education;Patient/family education;Manual techniques;Passive range of motion;Dry needling;Energy conservation;Taping    PT Next Visit Plan  HEP review, ROM, modalities prn, scapular and shoulder strengthening as tolerated    PT Home Exercise Plan  upper trap stretch, rhomboid stretch, sleeper stretch, seated forward  flexion low back stretch, scap retractions    Consulted and Agree with Plan of Care  Patient       Patient will benefit from skilled therapeutic intervention in order to improve the following deficits and impairments:  Pain, Improper body mechanics, Decreased mobility, Increased muscle spasms, Postural dysfunction, Decreased activity tolerance, Decreased endurance, Decreased range of motion, Decreased strength, Hypomobility, Impaired UE functional use  Visit Diagnosis: Muscle spasm of back  Muscle weakness (generalized)  Abnormal posture     Problem List Patient Active Problem List   Diagnosis Date Noted  . Concern about STD in female without diagnosis 11/04/2014  . Encounter for Depo-Provera contraception 09/03/2012  . DUB (dysfunctional uterine bleeding) 09/03/2012  . Weight loss, non-intentional 08/30/2011   Laurelyn Sickle, SPT 09/10/17 4:51 PM   [facility] [address] [place], [place], [address] Phone: [phone]   Fax:  [phone]  Name: Sara Arroyo MRN: [id number] Date of Birth: [birth year]

## 2017-09-24 ENCOUNTER — Encounter: Payer: Self-pay | Admitting: Physical Therapy

## 2017-09-24 ENCOUNTER — Ambulatory Visit: Payer: Medicaid Other | Admitting: Physical Therapy

## 2017-09-24 DIAGNOSIS — M6281 Muscle weakness (generalized): Secondary | ICD-10-CM | POA: Diagnosis not present

## 2017-09-24 DIAGNOSIS — M62830 Muscle spasm of back: Secondary | ICD-10-CM

## 2017-09-24 DIAGNOSIS — R293 Abnormal posture: Secondary | ICD-10-CM

## 2017-09-24 NOTE — Therapy (Signed)
[facility] [address] [place], [place], [address] Phone: [phone]   Fax:  [phone]  Physical Therapy Treatment  Patient Details  Name: Sara Arroyo MRN: [id number] Date of Birth: 1990/10/28 Referring Provider: Jerrell BelfastJackson, Chelsea, FNP   Encounter Date: 09/24/2017  PT End of Session - 09/24/17 1023    Visit Number  2    Number of Visits  13    Date for PT Re-Evaluation  10/22/17    PT Start Time  1015    PT Stop Time  1105    PT Time Calculation (min)  50 min    Activity Tolerance  Patient tolerated treatment well    Behavior During Therapy  [facility] for tasks assessed/performed       Past Medical History:  Diagnosis Date  . Abnormal Pap smear   . Hypokalemia     Past Surgical History:  Procedure Laterality Date  . COLPOSCOPY  10/2011    There were no vitals filed for this visit.  Subjective Assessment - 09/24/17 1020    Subjective  "I've not being doing my exercises because it made me sore"     Currently in Pain?  Yes    Pain Score  3     Pain Location  Back    Pain Orientation  Left    Pain Descriptors / Indicators  Sore;Aching    Pain Type  Chronic pain    Pain Onset  More than a month ago    Pain Frequency  Intermittent    Aggravating Factors   bending forward, stretching, sleeping wrong, laying the wrong"    Pain Relieving Factors  warm                        [facility] Adult PT Treatment/Exercise - 09/24/17 0001      Shoulder Exercises: Stretch   Other Shoulder Stretches  Rhomboid stretch 2 x 30 sec    Other Shoulder Stretches  upper trap stretch 2 x 30 sec      Modalities   Modalities  Electrical Stimulation;Moist Heat      Moist Heat Therapy   Number Minutes Moist Heat  10 Minutes    Moist Heat Location  Shoulder   pt in prone     Electrical Stimulation   Electrical Stimulation Location  L shoulder blade    Electrical Stimulation Action  IFC    Electrical Stimulation  Parameters  10 min, L6 100% scan     Electrical Stimulation Goals  Pain      Manual Therapy   Manual Therapy  Soft tissue mobilization    Manual therapy comments  MTPR along the L rhomboids    Soft tissue mobilization  IASTM along the L rhomboids             PT Education - 09/24/17 1057    Education Details  reviewed previously provided HEP and benefits of stretching but staying within the pain free range and doing it consistently to provide relief. benefits of manual trigger point release.     Person(s) Educated  Patient    Methods  Explanation;Verbal cues    Comprehension  Verbalized understanding;Verbal cues required       PT Short Term Goals - 09/10/17 1313      PT SHORT TERM GOAL #1   Title  Pt will be I with HEP    Baseline  issued HEP today    Time  3  Period  Weeks    Status  New    Target Date  10/01/17      PT SHORT TERM GOAL #2   Title  Pt will demonstrate/verbalize proper posture to reduce pain to </= 2/10 and promote efficient lifting mechanics    Baseline  forward head, rounded shoulders    Time  3    Period  Weeks    Status  New    Target Date  10/01/17        PT Long Term Goals - 09/10/17 1315      PT LONG TERM GOAL #1   Title  Pt will increase L shoulder AROM to [facility] with </= 2/10 pain in order to improve functional mobility and aid in ability to perform work related duties    Baseline  flexion 141 degrees, abduction 145 degrees, IR T10, ER T4    Time  6    Period  Weeks    Status  New    Target Date  10/22/17      PT LONG TERM GOAL #2   Title  Pt will increase L shoulder strength to >/= 4+5 to improve mobility and aid in ability to perform work related duties    Baseline  flex 3+, abd 4-, ext 4-, IR 4-    Time  6    Period  Weeks    Status  New    Target Date  10/22/17      PT LONG TERM GOAL #3   Title  Pt will be able to push, pull, and lift >/= 25 lbs with proper form to perform work related duties with </=2/10 pain    Baseline  pt  unable to push, pull, or lift without pain    Time  6    Period  Weeks    Status  New    Target Date  10/22/17      PT LONG TERM GOAL #4   Title  Pt will be I with HEP by final visit to ensure progress beyond PT discharge    Baseline  issued HEP today    Time  6    Period  Weeks    Status  New    Target Date  10/22/17            Plan - 09/24/17 1058    Clinical Impression Statement  pt reports continued pain and hasn't been compliant with her HEP due to having pain. reviewed HEP and benefits of consistency with stretching avoidig pain. focused on manual techniques to reduce muscle tension and pain and utilzied stim with heat. pt reported some relief end of session.     PT Treatment/Interventions  ADLs/Self Care Home Management;Electrical Stimulation;Cryotherapy;Iontophoresis 4mg /ml Dexamethasone;Moist Heat;Ultrasound;Gait training;Stair training;Functional mobility training;Therapeutic activities;Therapeutic exercise;Balance training;Neuromuscular re-education;Patient/family education;Manual techniques;Passive range of motion;Dry needling;Energy conservation;Taping    PT Next Visit Plan  HEP review, ROM, modalities prn, scapular and shoulder strengthening as tolerated    PT Home Exercise Plan  upper trap stretch, rhomboid stretch, sleeper stretch, seated forward flexion low back stretch, scap retractions    Consulted and Agree with Plan of Care  Patient       Patient will benefit from skilled therapeutic intervention in order to improve the following deficits and impairments:  Pain, Improper body mechanics, Decreased mobility, Increased muscle spasms, Postural dysfunction, Decreased activity tolerance, Decreased endurance, Decreased range of motion, Decreased strength, Hypomobility, Impaired UE functional use  Visit Diagnosis: Muscle spasm of back  Muscle weakness (  generalized)  Abnormal posture     Problem List Patient Active Problem List   Diagnosis Date Noted  .  Concern about STD in female without diagnosis 11/04/2014  . Encounter for Depo-Provera contraception 09/03/2012  . DUB (dysfunctional uterine bleeding) 09/03/2012  . Weight loss, non-intentional 08/30/2011   Lulu Riding PT, DPT, LAT, ATC  09/24/17  11:01 AM      [facility] [address] [place], [place], [address] Phone: [phone]   Fax:  [phone]  Name: Sara Arroyo MRN: [id number] Date of Birth: May 18, 1990

## 2017-09-26 ENCOUNTER — Encounter

## 2017-09-29 ENCOUNTER — Encounter: Payer: Self-pay | Admitting: Physical Therapy

## 2017-09-29 ENCOUNTER — Ambulatory Visit: Payer: Medicaid Other | Admitting: Physical Therapy

## 2017-09-29 DIAGNOSIS — M62830 Muscle spasm of back: Secondary | ICD-10-CM

## 2017-09-29 DIAGNOSIS — R293 Abnormal posture: Secondary | ICD-10-CM

## 2017-09-29 DIAGNOSIS — M6281 Muscle weakness (generalized): Secondary | ICD-10-CM | POA: Diagnosis not present

## 2017-09-29 NOTE — Therapy (Addendum)
[place], [place], [address] Phone: [phone]   Fax:  [phone]  Physical Therapy Treatment / Discharge Summary  Patient Details  Name: Sara Arroyo MRN: [id number] Date of Birth: 05-Jan-1991 Referring Provider: Elvera Bicker, FNP   Encounter Date: 09/29/2017  PT End of Session - 09/29/17 1003    Visit Number  3    Number of Visits  13    Date for PT Re-Evaluation  10/22/17    Authorization Type  MCD (resubmit at 4th visit)    Authorization Time Period  09/22/2017-10/12/2017 (for 3 visits),     Authorization - Visit Number  2    Authorization - Number of Visits  3    PT Start Time  1005    PT Stop Time  1045    PT Time Calculation (min)  40 min    Activity Tolerance  Patient tolerated treatment well    Behavior During Therapy  [facility] for tasks assessed/performed       Past Medical History:  Diagnosis Date  . Abnormal Pap smear   . Hypokalemia     Past Surgical History:  Procedure Laterality Date  . COLPOSCOPY  10/2011    There were no vitals filed for this visit.  Subjective Assessment - 09/29/17 1003    Subjective  "i didn't all of them but 2 because it causes me too much pain. today I am only having 2/10"    Patient Stated Goals  working without pain    Currently in Pain?  Yes    Pain Score  1     Pain Location  Back    Pain Orientation  Left    Pain Descriptors / Indicators  Sore;Aching    Pain Type  Chronic pain    Pain Onset  More than a month ago    Pain Frequency  Intermittent    Aggravating Factors   bending forward, stretching, sleeping wrong, laying the wrong                       [facility] Adult PT Treatment/Exercise - 09/29/17 1023      Self-Care   Self-Care  Other Self-Care Comments    Other Self-Care Comments   MTPR using tennis ball agains the wall in a pillow case to promote muscle relief      Shoulder Exercises: Supine   Protraction  10  reps;Strengthening;Left   tactile cues for proper form   Other Supine Exercises  scapular retraction with bil shoulder ER with yellow 2 x 10      Shoulder Exercises: Seated   Row  AROM;Strengthening;10 reps;Theraband    Theraband Level (Shoulder Row)  Level 2 (Red)      Shoulder Exercises: ROM/Strengthening   Nustep  L5 x 5 min UE/LE      Shoulder Exercises: Stretch   Other Shoulder Stretches  Rhomboid stretch 2 x 30 sec   from door knob, demonstration for properform   Other Shoulder Stretches  upper trap stretch 2 x 30 sec      Electrical Stimulation   Electrical Stimulation Location  --    Printmaker Action  --    Electrical Stimulation Parameters  --    Electrical Stimulation Goals  --      Manual Therapy   Manual therapy comments  MTPR along the L rhomboids   how perform at home using a tennis ball  PT Education - 09/29/17 1028    Education Details  manual trigger point release techniques, updated HEP    Person(s) Educated  Patient    Methods  Explanation;Demonstration;Verbal cues    Comprehension  Verbalized understanding;Returned demonstration;Verbal cues required       PT Short Term Goals - 09/10/17 1313      PT SHORT TERM GOAL #1   Title  Pt will be I with HEP    Baseline  issued HEP today    Time  3    Period  Weeks    Status  New    Target Date  10/01/17      PT SHORT TERM GOAL #2   Title  Pt will demonstrate/verbalize proper posture to reduce pain to </= 2/10 and promote efficient lifting mechanics    Baseline  forward head, rounded shoulders    Time  3    Period  Weeks    Status  New    Target Date  10/01/17        PT Long Term Goals - 09/10/17 1315      PT LONG TERM GOAL #1   Title  Pt will increase L shoulder AROM to [facility] with </= 2/10 pain in order to improve functional mobility and aid in ability to perform work related duties    Baseline  flexion 141 degrees, abduction 145 degrees, IR T10, ER T4    Time  6     Period  Weeks    Status  New    Target Date  10/22/17      PT LONG TERM GOAL #2   Title  Pt will increase L shoulder strength to >/= 4+5 to improve mobility and aid in ability to perform work related duties    Baseline  flex 3+, abd 4-, ext 4-, IR 4-    Time  6    Period  Weeks    Status  New    Target Date  10/22/17      PT LONG TERM GOAL #3   Title  Pt will be able to push, pull, and lift >/= 25 lbs with proper form to perform work related duties with </=2/10 pain    Baseline  pt unable to push, pull, or lift without pain    Time  6    Period  Weeks    Status  New    Target Date  10/22/17      PT LONG TERM GOAL #4   Title  Pt will be I with HEP by final visit to ensure progress beyond PT discharge    Baseline  issued HEP today    Time  6    Period  Weeks    Status  New    Target Date  10/22/17            Plan - 09/29/17 1036    Clinical Impression Statement  pt reports she has been consistent with only 2 exercises due to the others causing pain. educated how to perform manual trigger point release today and continued stretching. progress to scapular stability and strengthening today which she performed well requiring cues for proper form. end of session she reported feeling normal and declined modalities.     PT Treatment/Interventions  ADLs/Self Care Home Management;Electrical Stimulation;Cryotherapy;Iontophoresis 67m/ml Dexamethasone;Moist Heat;Ultrasound;Gait training;Stair training;Functional mobility training;Therapeutic activities;Therapeutic exercise;Balance training;Neuromuscular re-education;Patient/family education;Manual techniques;Passive range of motion;Dry needling;Energy conservation;Taping    PT Next Visit Plan  HEP review, ROM, modalities prn, scapular and  shoulder strengthening as tolerated    PT Home Exercise Plan  upper trap stretch, rhomboid stretch, sleeper stretch, seated forward flexion low back stretch, scap retractions    Consulted and Agree with  Plan of Care  Patient       Patient will benefit from skilled therapeutic intervention in order to improve the following deficits and impairments:  Pain, Improper body mechanics, Decreased mobility, Increased muscle spasms, Postural dysfunction, Decreased activity tolerance, Decreased endurance, Decreased range of motion, Decreased strength, Hypomobility, Impaired UE functional use  Visit Diagnosis: Muscle spasm of back  Muscle weakness (generalized)  Abnormal posture     Problem List Patient Active Problem List   Diagnosis Date Noted  . Concern about STD in female without diagnosis 11/04/2014  . Encounter for Depo-Provera contraception 09/03/2012  . DUB (dysfunctional uterine bleeding) 09/03/2012  . Weight loss, non-intentional 08/30/2011   Starr Lake PT, DPT, LAT, ATC  09/29/17  10:50 AM      [facility] [address] [place], [place], [address] Phone: [phone]   Fax:  [phone]  Name: Sara Arroyo MRN: [id number] Date of Birth: [birth year]        PHYSICAL THERAPY DISCHARGE SUMMARY  Visits from Start of Care: 3  Current functional level related to goals / functional outcomes: See goals   Remaining deficits: unknown   Education / Equipment: HEP, posture, theraband  Plan: Patient agrees to discharge.  Patient goals were not met. Patient is being discharged due to not returning since the last visit.  ?????         Aliea Bobe PT, DPT, LAT, ATC  11/12/17  10:33 AM

## 2017-10-08 ENCOUNTER — Ambulatory Visit: Payer: Medicaid Other | Admitting: Physical Therapy

## 2017-12-18 ENCOUNTER — Encounter (HOSPITAL_COMMUNITY): Payer: Self-pay | Admitting: Emergency Medicine

## 2017-12-18 ENCOUNTER — Other Ambulatory Visit: Payer: Self-pay

## 2017-12-18 ENCOUNTER — Emergency Department (HOSPITAL_COMMUNITY)
Admission: EM | Admit: 2017-12-18 | Discharge: 2017-12-18 | Disposition: A | Discharge status: Home / Self Care | Payer: Medicaid Other | Attending: Emergency Medicine | Admitting: Emergency Medicine

## 2017-12-18 DIAGNOSIS — L02414 Cutaneous abscess of left upper limb: Secondary | ICD-10-CM | POA: Insufficient documentation

## 2017-12-18 DIAGNOSIS — R2232 Localized swelling, mass and lump, left upper limb: Secondary | ICD-10-CM | POA: Diagnosis present

## 2017-12-18 DIAGNOSIS — R110 Nausea: Secondary | ICD-10-CM | POA: Insufficient documentation

## 2017-12-18 DIAGNOSIS — L03114 Cellulitis of left upper limb: Secondary | ICD-10-CM | POA: Diagnosis not present

## 2017-12-18 DIAGNOSIS — Z79899 Other long term (current) drug therapy: Secondary | ICD-10-CM | POA: Insufficient documentation

## 2017-12-18 DIAGNOSIS — L0291 Cutaneous abscess, unspecified: Secondary | ICD-10-CM

## 2017-12-18 MED ORDER — LIDOCAINE HCL (PF) 1 % IJ SOLN
5.0000 mL | Freq: Once | INTRAMUSCULAR | Status: DC
  Filled 2017-12-18: qty 30

## 2017-12-18 MED ORDER — IBUPROFEN 600 MG PO TABS: 600.0000 mg | ORAL_TABLET | Freq: Four times a day (QID) | ORAL | 0 refills | Status: DC | PRN

## 2017-12-18 MED ORDER — CLINDAMYCIN HCL 150 MG PO CAPS: 450.0000 mg | ORAL_CAPSULE | Freq: Three times a day (TID) | ORAL | 0 refills | Status: DC

## 2017-12-18 MED ORDER — PROBIOTIC 250 MG PO CAPS: 1.0000 | ORAL_CAPSULE | Freq: Every day | ORAL | 0 refills | Status: DC

## 2017-12-18 NOTE — ED Notes (Signed)
MD at bedside. 

## 2017-12-18 NOTE — ED Notes (Addendum)
Pt called, upset over mistake on work not, voicing negativity about mistake as well as yelling and Glass blower/designercursing writer, stating if she comes back we will have to call the police because she is going to show her ass . She would not allow suggestions from writer to be stated. Writer did call job to correct the mistake, spoke with Asher MuirJamie at [phone] as requested. Sara Arroyo was not in.  Job is aware she will return tomorrow not today, Asher MuirJamie gave verbal agreement she understands. In return, writer called pt back to let her know the conversation had taken place. Pt yelling and cursing stating Asher MuirJamie called and stated she had someone to call and it is all a lie. Tried to talk with pt who was making threats, cursing and yelling demanding to speak with someone above me. [name] RN [facility] has been notified to help with addressing the issue. Kiszia with registration and Gafferoyce RN present as pt was yelling at Emerson Electricwriter.

## 2017-12-18 NOTE — ED Provider Notes (Signed)
[facility]-EMERGENCY DEPT Provider Note   CSN: [id number] Arrival date & time: 12/18/17  1059     History   Chief Complaint Chief Complaint  Patient presents with  . Mass    on left arm    HPI Sara Arroyo is a 27 y.o. female who presents with mass to left shoulder for the past few days.  Patient denies any injury to the area.  She reports pain and redness.  She has had chills, but no fevers.  She reports she has felt nauseous.  Although, she is very anxious about it.  She denies any knowledge of anything biting her, although she has been staying in a hotel.  HPI  Past Medical History:  Diagnosis Date  . Abnormal Pap smear   . Hypokalemia     Patient Active Problem List   Diagnosis Date Noted  . Concern about STD in female without diagnosis 11/04/2014  . Encounter for Depo-Provera contraception 09/03/2012  . DUB (dysfunctional uterine bleeding) 09/03/2012  . Weight loss, non-intentional 08/30/2011    Past Surgical History:  Procedure Laterality Date  . COLPOSCOPY  10/2011     OB History    Gravida  2   Para  1   Term  1   Preterm      AB  1   Living  1     SAB      TAB  1   Ectopic      Multiple      Live Births               Home Medications    Prior to Admission medications   Medication Sig Start Date End Date Taking? Authorizing Provider  amoxicillin-clavulanate (AUGMENTIN) 875-125 MG tablet Take 1 tablet by mouth 2 (two) times daily. 11/14/14   Trixie DredgeWest, Emily, PA-C  clindamycin (CLEOCIN) 150 MG capsule Take 3 capsules (450 mg total) by mouth 3 (three) times daily. 12/18/17   Josehua Hammar, Waylan BogaAlexandra M, PA-C  ibuprofen (ADVIL,MOTRIN) 600 MG tablet Take 1 tablet (600 mg total) by mouth every 6 (six) hours as needed. 12/18/17   Merica Prell, Waylan BogaAlexandra M, PA-C  lidocaine (LIDODERM) 5 % Place 1 patch onto the skin daily. Remove & Discard patch within 12 hours or as directed by MD Patient not taking: Reported on 09/10/2017 08/04/17    Maczis, Elmer SowMichael M, PA-C  meloxicam (MOBIC) 15 MG tablet Take 1 tablet (15 mg total) by mouth daily. 08/04/17   Maczis, Elmer SowMichael M, PA-C  methocarbamol (ROBAXIN) 500 MG tablet Take 1 tablet (500 mg total) by mouth 2 (two) times daily. 08/04/17   Maczis, Elmer SowMichael M, PA-C  metroNIDAZOLE (FLAGYL) 500 MG tablet Take 1 tablet (500 mg total) by mouth 2 (two) times daily. Patient not taking: Reported on 09/10/2017 11/08/14   Adam PhenixArnold, James G, MD  ondansetron (ZOFRAN) 4 MG tablet Take 1 tablet (4 mg total) by mouth every 8 (eight) hours as needed for nausea or vomiting. Patient not taking: Reported on 09/10/2017 11/14/14   Trixie DredgeWest, Emily, PA-C  Saccharomyces boulardii (PROBIOTIC) 250 MG CAPS Take 1 capsule by mouth daily. 12/18/17   Emi HolesLaw, Shanie Mauzy M, PA-C    Family History Family History  Problem Relation Age of Onset  . Cancer Father     Social History Social History   Tobacco Use  . Smoking status: Never Smoker  . Smokeless tobacco: Never Used  Substance Use Topics  . Alcohol use: No  . Drug use: No  Allergies   Patient has no known allergies.   Review of Systems Review of Systems  Constitutional: Positive for chills. Negative for fever.  Gastrointestinal: Positive for nausea.  Skin: Positive for wound.     Physical Exam Updated Vital Signs BP (!) 106/59   Pulse 67   Temp 98.1 F (36.7 C) (Oral)   Resp 15   Ht 4\' 8"  (1.422 m)   Wt 49.4 kg   LMP 12/09/2017   SpO2 100%   BMI 24.44 kg/m   Physical Exam  Constitutional: She appears well-developed and well-nourished. No distress.  HENT:  Head: Normocephalic and atraumatic.  Mouth/Throat: Oropharynx is clear and moist. No oropharyngeal exudate.  Eyes: Pupils are equal, round, and reactive to light. Conjunctivae are normal. Right eye exhibits no discharge. Left eye exhibits no discharge. No scleral icterus.  Neck: Normal range of motion. Neck supple. No thyromegaly present.  Cardiovascular: Normal rate, regular rhythm, normal heart  sounds and intact distal pulses. Exam reveals no gallop and no friction rub.  No murmur heard. Pulmonary/Chest: Effort normal and breath sounds normal. No stridor. No respiratory distress. She has no wheezes. She has no rales.  Abdominal: Soft. Bowel sounds are normal. She exhibits no distension. There is no tenderness. There is no rebound and no guarding.  Musculoskeletal: She exhibits no edema.       Arms: Lymphadenopathy:    She has no cervical adenopathy.  Neurological: She is alert. Coordination normal.  Skin: Skin is warm and dry. No rash noted. She is not diaphoretic. No pallor.  Psychiatric: She has a normal mood and affect.  Nursing note and vitals reviewed.    ED Treatments / Results  Labs (all labs ordered are listed, but only abnormal results are displayed) Labs Reviewed - No data to display  EKG None  Radiology No results found.  Procedures .Marland KitchenIncision and Drainage Date/Time: 12/18/2017 1:55 PM Performed by: Emi Holes, PA-C Authorized by: Emi Holes, PA-C   Consent:    Consent obtained:  Verbal   Consent given by:  Patient   Risks discussed:  Bleeding, incomplete drainage and infection   Alternatives discussed:  No treatment Location:    Type:  Abscess   Size:  1 cm   Location:  Upper extremity   Upper extremity location:  Shoulder   Shoulder location:  L shoulder Pre-procedure details:    Skin preparation:  Chloraprep Anesthesia (see MAR for exact dosages):    Anesthesia method:  None Procedure type:    Complexity:  Simple Procedure details:    Needle aspiration: no     Incision types:  Stab incision   Scalpel blade:  11   Wound management:  Irrigated with saline   Drainage:  Bloody   Drainage amount:  Scant   Wound treatment:  Wound left open   Packing materials:  None Post-procedure details:    Patient tolerance of procedure:  Tolerated well, no immediate complications   (including critical care time)  Medications Ordered in  ED Medications  lidocaine (PF) (XYLOCAINE) 1 % injection 5 mL (5 mLs Infiltration Refused 12/18/17 1226)     Initial Impression / Assessment and Plan / ED Course  I have reviewed the triage vital signs and the nursing notes.  Pertinent labs & imaging results that were available during my care of the patient were reviewed by me and considered in my medical decision making (see chart for details).     Patient with skin abscess. Incision and drainage  performed in the ED today.  Abscess was not large enough to warrant packing or drain placement. Wound recheck in 2 days. Supportive care and return precautions discussed.  Pt sent home with clindamycin, ibuprofen. Will provide probiotic in conjunction with clindamycin. The patient appears reasonably screened and/or stabilized for discharge and I doubt any other emergent medical condition requiring further screening, evaluation, or treatment in the ED prior to discharge.    Final Clinical Impressions(s) / ED Diagnoses   Final diagnoses:  Abscess  Cellulitis of left upper extremity    ED Discharge Orders         Ordered    clindamycin (CLEOCIN) 150 MG capsule  3 times daily     12/18/17 1243    ibuprofen (ADVIL,MOTRIN) 600 MG tablet  Every 6 hours PRN     12/18/17 1243    Saccharomyces boulardii (PROBIOTIC) 250 MG CAPS  Daily     12/18/17 1243           Emi Holes, PA-C 12/18/17 1358    Raeford Razor, MD 12/19/17 [phone]

## 2017-12-18 NOTE — Discharge Instructions (Signed)
Take clindamycin until completed. Take with food. Take probiotic sometime during the day, not at the same time as Clindamycin, to help prevent stomach upset from antibiotic. Take ibuprofen as prescribed, as needed for pain. Use a warm compress on the area to help to continue drainage. Please return in 2 days for wound recheck. Please return to the emergency department if you develop any new or worsening symptoms including increasing pain, swelling, fever over 100.4.

## 2017-12-18 NOTE — ED Triage Notes (Signed)
Pt states she has had a bump on her left shoulder 2 weeks ago. Pt states she did not notice anything bite her.  Only thing coming out of it is blood.

## 2017-12-22 ENCOUNTER — Other Ambulatory Visit: Payer: Self-pay

## 2017-12-22 ENCOUNTER — Emergency Department (HOSPITAL_COMMUNITY)
Admission: EM | Admit: 2017-12-22 | Discharge: 2017-12-22 | Disposition: A | Discharge status: Home / Self Care | Payer: Medicaid Other | Attending: Emergency Medicine | Admitting: Emergency Medicine

## 2017-12-22 ENCOUNTER — Encounter (HOSPITAL_COMMUNITY): Payer: Self-pay | Admitting: Emergency Medicine

## 2017-12-22 DIAGNOSIS — L0291 Cutaneous abscess, unspecified: Secondary | ICD-10-CM

## 2017-12-22 DIAGNOSIS — L02414 Cutaneous abscess of left upper limb: Secondary | ICD-10-CM | POA: Insufficient documentation

## 2017-12-22 DIAGNOSIS — Z79899 Other long term (current) drug therapy: Secondary | ICD-10-CM | POA: Diagnosis not present

## 2017-12-22 DIAGNOSIS — R2232 Localized swelling, mass and lump, left upper limb: Secondary | ICD-10-CM | POA: Diagnosis present

## 2017-12-22 DIAGNOSIS — Z23 Encounter for immunization: Secondary | ICD-10-CM | POA: Diagnosis not present

## 2017-12-22 DIAGNOSIS — R110 Nausea: Secondary | ICD-10-CM | POA: Diagnosis not present

## 2017-12-22 DIAGNOSIS — L02413 Cutaneous abscess of right upper limb: Secondary | ICD-10-CM | POA: Diagnosis not present

## 2017-12-22 MED ORDER — TETANUS-DIPHTH-ACELL PERTUSSIS 5-2.5-18.5 LF-MCG/0.5 IM SUSP
0.5000 mL | Freq: Once | INTRAMUSCULAR | Status: AC
  Administered 2017-12-22: 0.5 mL via INTRAMUSCULAR
  Filled 2017-12-22: qty 0.5

## 2017-12-22 MED ORDER — LIDOCAINE-EPINEPHRINE (PF) 2 %-1:200000 IJ SOLN
10.0000 mL | Freq: Once | INTRAMUSCULAR | Status: AC
  Administered 2017-12-22: 10 mL
  Filled 2017-12-22: qty 20

## 2017-12-22 MED ORDER — DOXYCYCLINE HYCLATE 100 MG PO CAPS: 100.0000 mg | ORAL_CAPSULE | Freq: Two times a day (BID) | ORAL | 0 refills | Status: DC

## 2017-12-22 NOTE — ED Triage Notes (Signed)
Patient c/o abscess to left shoulder. Seen for same on Thursday. Reports increased pain and drainage since that time. States she started taking abx x2 days after being seen. No drainage noted on assessment.

## 2017-12-22 NOTE — ED Provider Notes (Signed)
[facility]-EMERGENCY DEPT Provider Note   CSN: [id number] Arrival date & time: 12/22/17  1238     History   Chief Complaint Chief Complaint  Patient presents with  . Abscess    HPI Sara Arroyo is a 27 y.o. female.  Sara Arroyo is a 27 y.o. Female with a history of dysfunctional uterine bleeding, who presents to the emergency department for evaluation of an abscess to the left shoulder, she was seen for the same about 4 days ago, and had a small amount of fluid drained from the area but reports since then she has had increasing pain and redness with some intermittent drainage from the area.  She started taking clindamycin 2 days ago, reports this medication has made her feel very "weird" so she has not been taking it regularly and reports has made her feel nauseated but she has not had any vomiting.  She denies any fevers or chills.  Is unsure of her last tetanus vaccine.  Has tried some warm compresses without improvement has not tried anything else to treat abscess.     Past Medical History:  Diagnosis Date  . Abnormal Pap smear   . Hypokalemia     Patient Active Problem List   Diagnosis Date Noted  . Concern about STD in female without diagnosis 11/04/2014  . Encounter for Depo-Provera contraception 09/03/2012  . DUB (dysfunctional uterine bleeding) 09/03/2012  . Weight loss, non-intentional 08/30/2011    Past Surgical History:  Procedure Laterality Date  . COLPOSCOPY  10/2011     OB History    Gravida  2   Para  1   Term  1   Preterm      AB  1   Living  1     SAB      TAB  1   Ectopic      Multiple      Live Births               Home Medications    Prior to Admission medications   Medication Sig Start Date End Date Taking? Authorizing Provider  amoxicillin-clavulanate (AUGMENTIN) 875-125 MG tablet Take 1 tablet by mouth 2 (two) times daily. 11/14/14   Trixie Dredge, PA-C  clindamycin (CLEOCIN) 150 MG  capsule Take 3 capsules (450 mg total) by mouth 3 (three) times daily. 12/18/17   Law, Waylan Boga, PA-C  ibuprofen (ADVIL,MOTRIN) 600 MG tablet Take 1 tablet (600 mg total) by mouth every 6 (six) hours as needed. 12/18/17   Law, Waylan Boga, PA-C  lidocaine (LIDODERM) 5 % Place 1 patch onto the skin daily. Remove & Discard patch within 12 hours or as directed by MD Patient not taking: Reported on 09/10/2017 08/04/17   Maczis, Elmer Sow, PA-C  meloxicam (MOBIC) 15 MG tablet Take 1 tablet (15 mg total) by mouth daily. 08/04/17   Maczis, Elmer Sow, PA-C  methocarbamol (ROBAXIN) 500 MG tablet Take 1 tablet (500 mg total) by mouth 2 (two) times daily. 08/04/17   Maczis, Elmer Sow, PA-C  metroNIDAZOLE (FLAGYL) 500 MG tablet Take 1 tablet (500 mg total) by mouth 2 (two) times daily. Patient not taking: Reported on 09/10/2017 11/08/14   Adam Phenix, MD  ondansetron (ZOFRAN) 4 MG tablet Take 1 tablet (4 mg total) by mouth every 8 (eight) hours as needed for nausea or vomiting. Patient not taking: Reported on 09/10/2017 11/14/14   Trixie Dredge, PA-C  Saccharomyces boulardii (PROBIOTIC) 250 MG CAPS Take 1  capsule by mouth daily. 12/18/17   Emi Holes, PA-C    Family History Family History  Problem Relation Age of Onset  . Cancer Father     Social History Social History   Tobacco Use  . Smoking status: Never Smoker  . Smokeless tobacco: Never Used  Substance Use Topics  . Alcohol use: No  . Drug use: No     Allergies   Patient has no known allergies.   Review of Systems Review of Systems  Gastrointestinal: Positive for nausea. Negative for vomiting.  Musculoskeletal: Positive for arthralgias. Negative for joint swelling.  Skin: Positive for color change and wound.       Abscess  Neurological: Negative for weakness and numbness.     Physical Exam Updated Vital Signs BP (!) 147/84 (BP Location: Right Arm)   Pulse 100   Temp 98.6 F (37 C) (Oral)   LMP 12/09/2017   SpO2 99%    Physical Exam  Constitutional: She appears well-developed and well-nourished. No distress.  HENT:  Head: Normocephalic and atraumatic.  Eyes: Right eye exhibits no discharge. Left eye exhibits no discharge.  Pulmonary/Chest: Effort normal. No respiratory distress.  Musculoskeletal:  2 x 2 centimeter raised fluctuant area over the posterior left shoulder with some surrounding erythema, there is a small head but no expressible drainage, the area is very tender to palpation, no redness or streaking extending down the arm.  Neurological: She is alert. Coordination normal.  Skin: Skin is warm and dry. Capillary refill takes less than 2 seconds. She is not diaphoretic.  Psychiatric: She has a normal mood and affect. Her behavior is normal.  Nursing note and vitals reviewed.    ED Treatments / Results  Labs (all labs ordered are listed, but only abnormal results are displayed) Labs Reviewed - No data to display  EKG None  Radiology No results found.  Procedures .Marland KitchenIncision and Drainage Date/Time: 12/22/2017 1:52 PM Performed by: Dartha Lodge, PA-C Authorized by: Dartha Lodge, PA-C   Consent:    Consent obtained:  Verbal   Consent given by:  Patient   Risks discussed:  Bleeding, incomplete drainage, pain, damage to other organs and infection   Alternatives discussed:  No treatment Location:    Type:  Abscess   Size:  2 x 2 cm   Location:  Upper extremity   Upper extremity location:  Shoulder   Shoulder location:  L shoulder Pre-procedure details:    Skin preparation:  Chloraprep Anesthesia (see MAR for exact dosages):    Anesthesia method:  Local infiltration   Local anesthetic:  Lidocaine 2% WITH epi Procedure type:    Complexity:  Simple Procedure details:    Incision types:  Single straight   Incision depth:  Dermal   Scalpel blade:  11   Wound management:  Probed and deloculated and irrigated with saline   Drainage:  Bloody and purulent   Drainage amount:   Moderate   Wound treatment:  Wound left open   Packing materials:  None Post-procedure details:    Patient tolerance of procedure:  Tolerated well, no immediate complications   (including critical care time)  Medications Ordered in ED Medications  lidocaine-EPINEPHrine (XYLOCAINE W/EPI) 2 %-1:200000 (PF) injection 10 mL (10 mLs Infiltration Given 12/22/17 1416)  Tdap (BOOSTRIX) injection 0.5 mL (0.5 mLs Intramuscular Given 12/22/17 1405)     Initial Impression / Assessment and Plan / ED Course  I have reviewed the triage vital signs and the nursing notes.  Pertinent labs & imaging results that were available during my care of the patient were reviewed by me and considered in my medical decision making (see chart for details).  Patient with skin abscess amenable to incision and drainage.  Abscess was not large enough to warrant packing or drain,  wound recheck in 2 days. Encouraged home warm soaks and flushing.  Patient was encouraged to finish her course of antibiotics but reports this is causing too many side effects and she does not want to take them, will prescribe doxycycline.  Discussed appropriate wound care.   Final Clinical Impressions(s) / ED Diagnoses   Final diagnoses:  Abscess    ED Discharge Orders    None       Legrand RamsFord, Clary Boulais N, PA-C 12/22/17 1422    Azalia Bilisampos, Kevin, MD 12/22/17 [phone]

## 2017-12-22 NOTE — Discharge Instructions (Signed)
Complete course of antibiotics prescribed today and discontinue clindamycin.  Keep the area covered, use warm compresses and warm soaks in the shower 2-3 times daily.  Follow-up with your primary care doctor in 2 days for recheck.  Return to the emergency department for fevers, significantly worsening redness, swelling, pain, increasing amounts of drainage or any other new or concerning symptoms.

## 2018-01-07 DIAGNOSIS — F319 Bipolar disorder, unspecified: Secondary | ICD-10-CM | POA: Diagnosis not present

## 2018-01-11 DIAGNOSIS — F319 Bipolar disorder, unspecified: Secondary | ICD-10-CM | POA: Diagnosis not present

## 2018-01-13 DIAGNOSIS — F319 Bipolar disorder, unspecified: Secondary | ICD-10-CM | POA: Diagnosis not present

## 2018-01-25 ENCOUNTER — Encounter (HOSPITAL_COMMUNITY): Payer: Self-pay

## 2018-01-25 ENCOUNTER — Emergency Department (HOSPITAL_COMMUNITY)
Admission: EM | Admit: 2018-01-25 | Discharge: 2018-01-25 | Disposition: A | Discharge status: Home / Self Care | Payer: Medicaid Other | Attending: Emergency Medicine | Admitting: Emergency Medicine

## 2018-01-25 ENCOUNTER — Other Ambulatory Visit: Payer: Self-pay

## 2018-01-25 DIAGNOSIS — O9989 Other specified diseases and conditions complicating pregnancy, childbirth and the puerperium: Secondary | ICD-10-CM | POA: Diagnosis not present

## 2018-01-25 DIAGNOSIS — O219 Vomiting of pregnancy, unspecified: Secondary | ICD-10-CM | POA: Diagnosis not present

## 2018-01-25 DIAGNOSIS — Z79899 Other long term (current) drug therapy: Secondary | ICD-10-CM | POA: Insufficient documentation

## 2018-01-25 DIAGNOSIS — R0981 Nasal congestion: Secondary | ICD-10-CM | POA: Diagnosis not present

## 2018-01-25 DIAGNOSIS — R112 Nausea with vomiting, unspecified: Secondary | ICD-10-CM | POA: Insufficient documentation

## 2018-01-25 DIAGNOSIS — M6281 Muscle weakness (generalized): Secondary | ICD-10-CM | POA: Diagnosis not present

## 2018-01-25 DIAGNOSIS — Z3A01 Less than 8 weeks gestation of pregnancy: Secondary | ICD-10-CM

## 2018-01-25 DIAGNOSIS — R634 Abnormal weight loss: Secondary | ICD-10-CM | POA: Diagnosis not present

## 2018-01-25 DIAGNOSIS — R531 Weakness: Secondary | ICD-10-CM | POA: Diagnosis not present

## 2018-01-25 LAB — URINALYSIS, ROUTINE W REFLEX MICROSCOPIC
Bilirubin Urine: NEGATIVE
Glucose, UA: 50 mg/dL — AB
HGB URINE DIPSTICK: NEGATIVE
KETONES UR: NEGATIVE mg/dL
Leukocytes, UA: NEGATIVE
Nitrite: NEGATIVE
PH: 6 (ref 5.0–8.0)
PROTEIN: NEGATIVE mg/dL
SPECIFIC GRAVITY, URINE: 1.028 (ref 1.005–1.030)

## 2018-01-25 LAB — POC URINE PREG, ED: PREG TEST UR: POSITIVE — AB

## 2018-01-25 MED ORDER — ONDANSETRON 8 MG PO TBDP
8.0000 mg | ORAL_TABLET | Freq: Once | ORAL | Status: AC
  Administered 2018-01-25: 8 mg via ORAL
  Filled 2018-01-25: qty 1

## 2018-01-25 MED ORDER — METOCLOPRAMIDE HCL 10 MG PO TABS: 10.0000 mg | ORAL_TABLET | Freq: Four times a day (QID) | ORAL | 0 refills | Status: DC

## 2018-01-25 NOTE — ED Provider Notes (Signed)
[facility]-EMERGENCY DEPT Provider Note   CSN: [id number] Arrival date & time: 01/25/18  1336     History   Chief Complaint Chief Complaint  Patient presents with  . flu like symptoms    HPI Sara Arroyo is a 27 y.o. female.  HPI Sara Arroyo is a 27 y.o. female presents to emergency department complaining of body aches, generalized weakness, weight loss, nausea, vomiting, states unable to keep anything down including water.  She states she also missed menstrual period, however she is on Depo.  Denies any abdominal pain, but then states "I have sharp pains everywhere." Denies any diarrhea. States "there is something wrong, I have been sick for a month." she denies any upper respiratory symptoms other than rhinorrhea which she has had persistently for a month.  She states it is clear with no sinus pain.  She states her symptoms initially started around the same time she was seen here for an abscess of the left shoulder.  This abscess was drained and she was treated with clindamycin, when she came back for recheck, abscess was drained again and she was placed on doxycycline.  She states initially she thought she was allergic to having reaction to clindamycin but since finishing all of the medicine and her symptoms did not improve.  Again she denies any diarrhea or abdominal pain.  Past Medical History:  Diagnosis Date  . Abnormal Pap smear   . Hypokalemia     Patient Active Problem List   Diagnosis Date Noted  . Concern about STD in female without diagnosis 11/04/2014  . Encounter for Depo-Provera contraception 09/03/2012  . DUB (dysfunctional uterine bleeding) 09/03/2012  . Weight loss, non-intentional 08/30/2011    Past Surgical History:  Procedure Laterality Date  . COLPOSCOPY  10/2011     OB History    Gravida  2   Para  1   Term  1   Preterm      AB  1   Living  1     SAB      TAB  1   Ectopic      Multiple      Live  Births               Home Medications    Prior to Admission medications   Medication Sig Start Date End Date Taking? Authorizing Provider  clindamycin (CLEOCIN) 150 MG capsule Take 3 capsules (450 mg total) by mouth 3 (three) times daily. 12/18/17   Emi HolesLaw, Alexandra M, PA-C  doxycycline (VIBRAMYCIN) 100 MG capsule Take 1 capsule (100 mg total) by mouth 2 (two) times daily. One po bid x 7 days 12/22/17   Dartha LodgeFord, Kelsey N, PA-C  ibuprofen (ADVIL,MOTRIN) 600 MG tablet Take 1 tablet (600 mg total) by mouth every 6 (six) hours as needed. 12/18/17   Law, Waylan BogaAlexandra M, PA-C  lidocaine (LIDODERM) 5 % Place 1 patch onto the skin daily. Remove & Discard patch within 12 hours or as directed by MD Patient not taking: Reported on 09/10/2017 08/04/17   Maczis, Elmer SowMichael M, PA-C  meloxicam (MOBIC) 15 MG tablet Take 1 tablet (15 mg total) by mouth daily. 08/04/17   Maczis, Elmer SowMichael M, PA-C  methocarbamol (ROBAXIN) 500 MG tablet Take 1 tablet (500 mg total) by mouth 2 (two) times daily. 08/04/17   Maczis, Elmer SowMichael M, PA-C  metroNIDAZOLE (FLAGYL) 500 MG tablet Take 1 tablet (500 mg total) by mouth 2 (two) times daily. Patient not taking: Reported  on 09/10/2017 11/08/14   Adam PhenixArnold, James G, MD  ondansetron (ZOFRAN) 4 MG tablet Take 1 tablet (4 mg total) by mouth every 8 (eight) hours as needed for nausea or vomiting. Patient not taking: Reported on 09/10/2017 11/14/14   Trixie DredgeWest, Emily, PA-C  Saccharomyces boulardii (PROBIOTIC) 250 MG CAPS Take 1 capsule by mouth daily. 12/18/17   Emi HolesLaw, Alexandra M, PA-C    Family History Family History  Problem Relation Age of Onset  . Cancer Father     Social History Social History   Tobacco Use  . Smoking status: Never Smoker  . Smokeless tobacco: Never Used  Substance Use Topics  . Alcohol use: No  . Drug use: No     Allergies   Patient has no known allergies.   Review of Systems Review of Systems  Constitutional: Negative for chills and fever.  HENT: Positive for congestion  and rhinorrhea. Negative for ear pain, sinus pressure, sinus pain and sore throat.   Respiratory: Negative for cough, chest tightness and shortness of breath.   Cardiovascular: Negative for chest pain, palpitations and leg swelling.  Gastrointestinal: Positive for nausea and vomiting. Negative for abdominal pain and diarrhea.  Genitourinary: Negative for dysuria, flank pain, pelvic pain, vaginal bleeding, vaginal discharge and vaginal pain.  Musculoskeletal: Positive for arthralgias and myalgias. Negative for neck pain and neck stiffness.  Skin: Negative for rash.  Neurological: Positive for headaches. Negative for dizziness and weakness.  All other systems reviewed and are negative.    Physical Exam Updated Vital Signs BP 116/67 (BP Location: Left Arm)   Pulse (!) 57   Temp 98.6 F (37 C) (Oral)   Resp 14   Ht 4\' 8"  (1.422 m)   Wt 51.3 kg   LMP 12/17/2017   SpO2 100%   BMI 25.33 kg/m   Physical Exam Vitals signs and nursing note reviewed.  Constitutional:      General: She is not in acute distress.    Appearance: She is well-developed.  HENT:     Head: Normocephalic.  Eyes:     Conjunctiva/sclera: Conjunctivae normal.  Neck:     Musculoskeletal: Neck supple.  Cardiovascular:     Rate and Rhythm: Normal rate and regular rhythm.     Heart sounds: Normal heart sounds.  Pulmonary:     Effort: Pulmonary effort is normal. No respiratory distress.     Breath sounds: Normal breath sounds. No wheezing or rales.  Abdominal:     General: Bowel sounds are normal. There is no distension.     Palpations: Abdomen is soft.     Tenderness: There is no abdominal tenderness. There is no rebound.  Skin:    General: Skin is warm and dry.  Neurological:     Mental Status: She is alert.  Psychiatric:        Behavior: Behavior normal.      ED Treatments / Results  Labs (all labs ordered are listed, but only abnormal results are displayed) Labs Reviewed  URINALYSIS, ROUTINE W  REFLEX MICROSCOPIC - Abnormal; Notable for the following components:      Result Value   Glucose, UA 50 (*)    All other components within normal limits  POC URINE PREG, ED - Abnormal; Notable for the following components:   Preg Test, Ur POSITIVE (*)    All other components within normal limits    EKG None  Radiology No results found.  Procedures Procedures (including critical care time)  Medications Ordered in ED Medications  ondansetron (ZOFRAN-ODT) disintegrating tablet 8 mg (has no administration in time range)     Initial Impression / Assessment and Plan / ED Course  I have reviewed the triage vital signs and the nursing notes.  Pertinent labs & imaging results that were available during my care of the patient were reviewed by me and considered in my medical decision making (see chart for details).    Patient with generalized malaise, nausea, vomiting, rhinorrhea for the last month.  Has had a course of clindamycin and then doxycycline for this abscess that she has had on the left shoulder which she attributed her symptoms to.  It appears from the prior notes, patient has refused pregnancy exam in the past and she was warned that these medications they placed her on are not safe in pregnancy and she accepted that warning.  I will check pregnancy test on her today and will get urine analysis done.  I also offered her blood work but she is refusing.  3:49 PM Patient's pregnancy test is positive.  Her last normal menstrual cycle was November third, she thinks but she is not sure.  This puts her at approximately 7 weeks.  Patient has no abdominal pain or tenderness on exam right now.  No vaginal bleeding.  I am not concerned about an ectopic pregnancy.  Her nausea and vomiting, rhinorrhea, body aches are most likely due to pregnancy.  Urine analysis shows 50 of glucose, and I again offered her blood test but she refused.  I advised her to follow-up with health department or OB/GYN  for further evaluation and treatment.  Patient did voice to me that she is planning on aborting this pregnancy.  Nevertheless I advised her to start prenatal vitamins just in case she decides to keep the pregnancy.  I will give her prescription for reglan.   Vitals:   01/25/18 1348  BP: 116/67  Pulse: (!) 57  Resp: 14  Temp: 98.6 F (37 C)  TempSrc: Oral  SpO2: 100%  Weight: 51.3 kg  Height: 4\' 8"  (1.422 m)     Final Clinical Impressions(s) / ED Diagnoses   Final diagnoses:  Less than [redacted] weeks gestation of pregnancy    ED Discharge Orders         Ordered    metoCLOPramide (REGLAN) 10 MG tablet  Every 6 hours     01/25/18 1552           Jaynie Crumble, PA-C 01/25/18 1854    Shaune Pollack, MD 01/26/18 [phone]

## 2018-01-25 NOTE — Discharge Instructions (Addendum)
[facility] and [facility].  If you are having any problems, [facility]  Start taking prenatal vitamins.  Take Tylenol for any body aches.  Take Reglan as prescribed as needed for nausea and vomiting.

## 2018-01-25 NOTE — ED Triage Notes (Signed)
Pt reports that she has been throwing up x 4 x / day, nausea with dizziness, generalized body aches worsening x 3 weeks.  Pt states that she has hot and cold spells. Pt states she might be pregnant but declined pregnancy test last visit.

## 2018-01-29 ENCOUNTER — Emergency Department (HOSPITAL_COMMUNITY): Payer: Medicaid Other

## 2018-01-29 ENCOUNTER — Encounter (HOSPITAL_COMMUNITY): Payer: Self-pay

## 2018-01-29 ENCOUNTER — Other Ambulatory Visit: Payer: Self-pay

## 2018-01-29 ENCOUNTER — Emergency Department (HOSPITAL_COMMUNITY)
Admission: EM | Admit: 2018-01-29 | Discharge: 2018-01-29 | Disposition: A | Discharge status: Home / Self Care | Payer: Medicaid Other | Attending: Emergency Medicine | Admitting: Emergency Medicine

## 2018-01-29 DIAGNOSIS — O26891 Other specified pregnancy related conditions, first trimester: Secondary | ICD-10-CM | POA: Diagnosis not present

## 2018-01-29 DIAGNOSIS — R102 Pelvic and perineal pain: Secondary | ICD-10-CM | POA: Diagnosis not present

## 2018-01-29 DIAGNOSIS — O9989 Other specified diseases and conditions complicating pregnancy, childbirth and the puerperium: Secondary | ICD-10-CM | POA: Insufficient documentation

## 2018-01-29 DIAGNOSIS — Z3A08 8 weeks gestation of pregnancy: Secondary | ICD-10-CM

## 2018-01-29 DIAGNOSIS — O210 Mild hyperemesis gravidarum: Secondary | ICD-10-CM | POA: Diagnosis not present

## 2018-01-29 DIAGNOSIS — N760 Acute vaginitis: Secondary | ICD-10-CM | POA: Insufficient documentation

## 2018-01-29 DIAGNOSIS — Z79899 Other long term (current) drug therapy: Secondary | ICD-10-CM | POA: Diagnosis not present

## 2018-01-29 DIAGNOSIS — R509 Fever, unspecified: Secondary | ICD-10-CM | POA: Diagnosis not present

## 2018-01-29 DIAGNOSIS — B9689 Other specified bacterial agents as the cause of diseases classified elsewhere: Secondary | ICD-10-CM | POA: Diagnosis not present

## 2018-01-29 DIAGNOSIS — R5381 Other malaise: Secondary | ICD-10-CM | POA: Diagnosis not present

## 2018-01-29 LAB — URINALYSIS, ROUTINE W REFLEX MICROSCOPIC
Bilirubin Urine: NEGATIVE
Glucose, UA: NEGATIVE mg/dL
Hgb urine dipstick: NEGATIVE
Ketones, ur: 80 mg/dL — AB
Leukocytes, UA: NEGATIVE
NITRITE: NEGATIVE
PROTEIN: NEGATIVE mg/dL
Specific Gravity, Urine: 1.021 (ref 1.005–1.030)
pH: 6 (ref 5.0–8.0)

## 2018-01-29 LAB — CBC
HCT: 40.5 % (ref 36.0–46.0)
Hemoglobin: 13.8 g/dL (ref 12.0–15.0)
MCH: 32.5 pg (ref 26.0–34.0)
MCHC: 34.1 g/dL (ref 30.0–36.0)
MCV: 95.5 fL (ref 80.0–100.0)
Platelets: 253 10*3/uL (ref 150–400)
RBC: 4.24 MIL/uL (ref 3.87–5.11)
RDW: 11.9 % (ref 11.5–15.5)
WBC: 9.6 10*3/uL (ref 4.0–10.5)
nRBC: 0 % (ref 0.0–0.2)

## 2018-01-29 LAB — COMPREHENSIVE METABOLIC PANEL
ALK PHOS: 39 U/L (ref 38–126)
ALT: 14 U/L (ref 0–44)
AST: 20 U/L (ref 15–41)
Albumin: 4.1 g/dL (ref 3.5–5.0)
Anion gap: 10 (ref 5–15)
BUN: 9 mg/dL (ref 6–20)
CALCIUM: 9.2 mg/dL (ref 8.9–10.3)
CO2: 20 mmol/L — ABNORMAL LOW (ref 22–32)
Chloride: 105 mmol/L (ref 98–111)
Creatinine, Ser: 0.71 mg/dL (ref 0.44–1.00)
GFR calc non Af Amer: >60 mL/min (ref >60)
Glucose, Bld: 81 mg/dL (ref 70–99)
Potassium: 3.5 mmol/L (ref 3.5–5.1)
Sodium: 135 mmol/L (ref 135–145)
TOTAL PROTEIN: 7 g/dL (ref 6.5–8.1)
Total Bilirubin: 0.5 mg/dL (ref 0.3–1.2)

## 2018-01-29 LAB — WET PREP, GENITAL
Sperm: NONE SEEN
Trich, Wet Prep: NONE SEEN
Yeast Wet Prep HPF POC: NONE SEEN

## 2018-01-29 LAB — LIPASE, BLOOD: Lipase: 31 U/L (ref 11–51)

## 2018-01-29 LAB — HCG, QUANTITATIVE, PREGNANCY: hCG, Beta Chain, Quant, S: 156558 m[IU]/mL — ABNORMAL HIGH (ref <5)

## 2018-01-29 MED ORDER — SODIUM CHLORIDE 0.9 % IV BOLUS
1000.0000 mL | Freq: Once | INTRAVENOUS | Status: AC
  Administered 2018-01-29: 1000 mL via INTRAVENOUS

## 2018-01-29 MED ORDER — ONDANSETRON 8 MG PO TBDP: 8.0000 mg | ORAL_TABLET | Freq: Three times a day (TID) | ORAL | 0 refills | Status: DC | PRN

## 2018-01-29 MED ORDER — METRONIDAZOLE 500 MG PO TABS: 500.0000 mg | ORAL_TABLET | Freq: Two times a day (BID) | ORAL | 0 refills | Status: DC

## 2018-01-29 MED ORDER — ONDANSETRON HCL 4 MG/2ML IJ SOLN
4.0000 mg | Freq: Once | INTRAMUSCULAR | Status: AC
  Administered 2018-01-29: 4 mg via INTRAVENOUS
  Filled 2018-01-29: qty 2

## 2018-01-29 NOTE — Discharge Instructions (Addendum)
Please use zofran for nausea Take metronidazole as prescribed [facility]

## 2018-01-29 NOTE — ED Notes (Signed)
Provided a cup of water to patient for PO challenge. Patient states that she is feeling nausea and will notify RN if she vomits.

## 2018-01-29 NOTE — ED Provider Notes (Signed)
[facility]-EMERGENCY DEPT Provider Note   CSN: [id number] Arrival date & time: 01/29/18  1455     History   Chief Complaint Chief Complaint  Patient presents with  . flu symptoms  . Emesis    HPI Sara Arroyo is a 27 y.o. female.  HPI  27 yo female presents stating she has had nausea and vomiting for 2 months.  Patient seen here on December 22 for similar symptoms and had a positive pregnancy test.  Patient states she also had an abscess in November and was treated with clindamycin.  REcheck doxycycline.  Patient has not had period for several years due to depo shots but has not had a depo shot since 03/2016.  Patient states vomits multiple times each day.  She has noted some areas of questinable infection left upper arm near whre abscess was. [facility] told her she may have had a fever.  Patient is not sure whether or not as she has had a fever or chills.  She states she has pain when she sleeps on her side in her chest or stomach. Pain was in her chest today then went through her old body hitting her ankles.  She states she passed out after she got stuck for blood.    Past Medical History:  Diagnosis Date  . Abnormal Pap smear   . Hypokalemia     Patient Active Problem List   Diagnosis Date Noted  . Concern about STD in female without diagnosis 11/04/2014  . Encounter for Depo-Provera contraception 09/03/2012  . DUB (dysfunctional uterine bleeding) 09/03/2012  . Weight loss, non-intentional 08/30/2011    Past Surgical History:  Procedure Laterality Date  . COLPOSCOPY  10/2011     OB History    Gravida  3   Para  1   Term  1   Preterm      AB  1   Living  1     SAB      TAB  1   Ectopic      Multiple      Live Births               Home Medications    Prior to Admission medications   Medication Sig Start Date End Date Taking? Authorizing Provider  clindamycin (CLEOCIN) 150 MG capsule Take 3 capsules (450 mg  total) by mouth 3 (three) times daily. 12/18/17   Emi HolesLaw, Alexandra M, PA-C  doxycycline (VIBRAMYCIN) 100 MG capsule Take 1 capsule (100 mg total) by mouth 2 (two) times daily. One po bid x 7 days 12/22/17   Dartha LodgeFord, Kelsey N, PA-C  ibuprofen (ADVIL,MOTRIN) 600 MG tablet Take 1 tablet (600 mg total) by mouth every 6 (six) hours as needed. 12/18/17   Law, Waylan BogaAlexandra M, PA-C  lidocaine (LIDODERM) 5 % Place 1 patch onto the skin daily. Remove & Discard patch within 12 hours or as directed by MD Patient not taking: Reported on 09/10/2017 08/04/17   Maczis, Elmer SowMichael M, PA-C  meloxicam (MOBIC) 15 MG tablet Take 1 tablet (15 mg total) by mouth daily. 08/04/17   Maczis, Elmer SowMichael M, PA-C  methocarbamol (ROBAXIN) 500 MG tablet Take 1 tablet (500 mg total) by mouth 2 (two) times daily. 08/04/17   Maczis, Elmer SowMichael M, PA-C  metoCLOPramide (REGLAN) 10 MG tablet Take 1 tablet (10 mg total) by mouth every 6 (six) hours. 01/25/18   Kirichenko, Lemont Fillersatyana, PA-C  metroNIDAZOLE (FLAGYL) 500 MG tablet Take 1 tablet (500 mg total)  by mouth 2 (two) times daily. Patient not taking: Reported on 09/10/2017 11/08/14   Adam PhenixArnold, James G, MD  ondansetron (ZOFRAN) 4 MG tablet Take 1 tablet (4 mg total) by mouth every 8 (eight) hours as needed for nausea or vomiting. Patient not taking: Reported on 09/10/2017 11/14/14   Trixie DredgeWest, Emily, PA-C  Saccharomyces boulardii (PROBIOTIC) 250 MG CAPS Take 1 capsule by mouth daily. 12/18/17   Emi HolesLaw, Alexandra M, PA-C    Family History Family History  Problem Relation Age of Onset  . Cancer Father     Social History Social History   Tobacco Use  . Smoking status: Never Smoker  . Smokeless tobacco: Never Used  Substance Use Topics  . Alcohol use: No  . Drug use: No     Allergies   Patient has no known allergies.   Review of Systems Review of Systems   Physical Exam Updated Vital Signs BP (!) 112/58   Pulse 68   Temp 98.8 F (37.1 C) (Oral)   Resp 16   Ht 1.422 m (4\' 8" )   Wt 51.8 kg   LMP  12/17/2017   SpO2 100%   BMI 25.60 kg/m   Physical Exam   ED Treatments / Results  Labs (all labs ordered are listed, but only abnormal results are displayed) Labs Reviewed  COMPREHENSIVE METABOLIC PANEL - Abnormal; Notable for the following components:      Result Value   CO2 20 (*)    All other components within normal limits  LIPASE, BLOOD  CBC  URINALYSIS, ROUTINE W REFLEX MICROSCOPIC    EKG None  Radiology [place] Ob Less Than 14 Weeks With Ob Transvaginal  Result Date: 01/29/2018 CLINICAL DATA:  Pelvic pain in the first trimester pregnancy. EXAM: OBSTETRIC <14 WK US AND TRANSVAGINAL OB US TECHNIQUE: Both transabdominal and transvaginal ultrasound examinations were performed for complete evaluation of the gestation as well as the maternal uterus, adnexal regions, and pelvic cul-de-sac. Transvaginal technique was performed to assess early pregnancy. COMPARISON:  None. FINDINGS: Intrauterine gestational sac: Single Yolk sac:  Visualized. Embryo:  Visualized. Cardiac Activity: Visualized. Heart Rate: 170 bpm CRL:  19 mm   8 w   3 d                  US EDC: 09/07/2018 Subchorionic hemorrhage:  None visualized. Maternal uterus/adnexae: Normal with trace free fluid in pelvis likely physiologic. IMPRESSION: Single live intrauterine 8 week 3 day gestation without complicating features. Electronically Signed   By: Tollie Ethavid  Kwon M.D.   On: 01/29/2018 22:52    Procedures Procedures (including critical care time)  Medications Ordered in ED Medications - No data to display   Initial Impression / Assessment and Plan / ED Course  I have reviewed the triage vital signs and the nursing notes.  Pertinent labs & imaging results that were available during my care of the patient were reviewed by me and considered in my medical decision making (see chart for details).     1- hyperemesis gravidarum 2- early pregnancy 8 w 3 day 3- bv  Final Clinical Impressions(s) / ED Diagnoses   Final  diagnoses:  Pelvic pain  Hyperemesis gravidarum  [redacted] weeks gestation of pregnancy  BV (bacterial vaginosis)    ED Discharge Orders    None       Margarita Grizzleay, [place] Kolenovic, MD 01/29/18 2318

## 2018-01-29 NOTE — ED Notes (Signed)
US at bedside

## 2018-01-29 NOTE — ED Triage Notes (Addendum)
Per EMS- patient c/o flu symptoms x 1 month. Patient states she has had 2 negative flu tests. Patient c/o fever, chills, and sore throat.  When writer came into triage room the patient had thrown face mask in the floor and stated, "I do not have the flu. I was told I was pregnant the other day. I am miserable. My body is shutting down. (talking in a hateful tone)  Patient states "I not allowed at women's and I do not know why. I don't want this baby. I want this baby out of me."

## 2018-01-30 ENCOUNTER — Other Ambulatory Visit: Payer: Self-pay

## 2018-01-30 LAB — GC/CHLAMYDIA PROBE AMP (~~LOC~~) NOT AT ARMC
Chlamydia: NEGATIVE
Neisseria Gonorrhea: NEGATIVE

## 2018-01-30 NOTE — ED Notes (Signed)
Patient called this facility regarding her prescriptions that were sent from 12/26 visit-states she wanted to know what her scripts were for prior to paying for them-writer explained medications using brand name and generic-states she didn't get a script for her prenatal vitamins-informed patient she didn't need a script and they could be purchased via over the counter-patient became verbally abusive and yelling on phone-attempted to clarify her meds and usage but she did not want to listen. Patient verbally made threats to writer stating she was coming to hospital to "show her ass" as well as "fuck us up". Writer hung phone up and proceeded to inform EDP, who treated yesterday, of patient's behavior regarding her care yesterday-patient proceeded to call several times making threats-another RN attempted to help patient but patient refused to listen and nurse had to end conversation.  Off duty Technical sales engineerofficer and security notified.

## 2018-02-04 NOTE — L&D Delivery Note (Addendum)
Patient ID: Sara Arroyo, female   DOB: December 05, 1990, 28 y.o.   MRN: [id number]   SVD 09/04/18 at 1629. Called to room roughly ten minutes following SVD to relieve Dr. Gala Romney and manage delivery of placenta. Placenta delivered spontaneously with gentle cord traction at 1648. Appears intact. Fundus firm with massage and Pitocin. Labia, perineum, vagina, and cervix inspected with bilateral labial lacerations.   Lacerations: bilateral labial, shallow and hemostatic, repair not indicated  QBL via Triton (mL): 227  See separate delivery note by Dr. Gala Romney.  Addendum: corrected date of service  Mallie Snooks, [place] 09/04/18 5:56 PM

## 2018-02-04 NOTE — L&D Delivery Note (Addendum)
Delivery Note At 4:29 PM a viable female was delivered via Vaginal, Spontaneous.  Presentation: vertex; Position: Right,, Occiput,, Anterior;  Delivery of the head:  First maneurver--McRoberts   Second maneuver: Suprapubic pressure attempted by RN.  She did not have a stool and was unable to give meaningful suprapubic pressure Third maneuver--Rubin  APGAR: 8, 9; weight 6 lb 12.8 oz (3084 g).   Placenta status: , .   Cord:  with the following complications: .  Cord pH: in labs  Anesthesia:  epidural Episiotomy: None Lacerations: Labial  At this time I was called to OR for cesarean section.  [name], CNM, came for delivery of placenta.  Sara Arroyo 09/11/2018, 1:47 PM

## 2018-03-09 DIAGNOSIS — Z1388 Encounter for screening for disorder due to exposure to contaminants: Secondary | ICD-10-CM | POA: Diagnosis not present

## 2018-03-09 DIAGNOSIS — Z8759 Personal history of other complications of pregnancy, childbirth and the puerperium: Secondary | ICD-10-CM | POA: Diagnosis not present

## 2018-03-09 DIAGNOSIS — R87610 Atypical squamous cells of undetermined significance on cytologic smear of cervix (ASC-US): Secondary | ICD-10-CM | POA: Diagnosis not present

## 2018-03-09 DIAGNOSIS — Z3481 Encounter for supervision of other normal pregnancy, first trimester: Secondary | ICD-10-CM | POA: Diagnosis not present

## 2018-03-09 DIAGNOSIS — Z789 Other specified health status: Secondary | ICD-10-CM | POA: Diagnosis not present

## 2018-03-09 DIAGNOSIS — Z8742 Personal history of other diseases of the female genital tract: Secondary | ICD-10-CM | POA: Diagnosis not present

## 2018-03-09 DIAGNOSIS — L282 Other prurigo: Secondary | ICD-10-CM | POA: Diagnosis not present

## 2018-03-09 DIAGNOSIS — Z0389 Encounter for observation for other suspected diseases and conditions ruled out: Secondary | ICD-10-CM | POA: Diagnosis not present

## 2018-03-09 DIAGNOSIS — Z3009 Encounter for other general counseling and advice on contraception: Secondary | ICD-10-CM | POA: Diagnosis not present

## 2018-03-09 LAB — OB RESULTS CONSOLE RPR: RPR: NONREACTIVE

## 2018-03-09 LAB — OB RESULTS CONSOLE HIV ANTIBODY (ROUTINE TESTING): HIV: NONREACTIVE

## 2018-03-09 LAB — OB RESULTS CONSOLE RUBELLA ANTIBODY, IGM: Rubella: IMMUNE

## 2018-03-09 LAB — OB RESULTS CONSOLE HEPATITIS B SURFACE ANTIGEN: Hepatitis B Surface Ag: NEGATIVE

## 2018-04-01 DIAGNOSIS — O281 Abnormal biochemical finding on antenatal screening of mother: Secondary | ICD-10-CM | POA: Diagnosis not present

## 2018-04-01 DIAGNOSIS — Z3A17 17 weeks gestation of pregnancy: Secondary | ICD-10-CM | POA: Diagnosis not present

## 2018-04-20 DIAGNOSIS — Z3482 Encounter for supervision of other normal pregnancy, second trimester: Secondary | ICD-10-CM | POA: Diagnosis not present

## 2018-04-20 DIAGNOSIS — Z8742 Personal history of other diseases of the female genital tract: Secondary | ICD-10-CM | POA: Diagnosis not present

## 2018-04-20 DIAGNOSIS — L282 Other prurigo: Secondary | ICD-10-CM | POA: Diagnosis not present

## 2018-04-20 DIAGNOSIS — O99330 Smoking (tobacco) complicating pregnancy, unspecified trimester: Secondary | ICD-10-CM | POA: Diagnosis not present

## 2018-04-20 DIAGNOSIS — Z8759 Personal history of other complications of pregnancy, childbirth and the puerperium: Secondary | ICD-10-CM | POA: Diagnosis not present

## 2018-04-20 DIAGNOSIS — Z789 Other specified health status: Secondary | ICD-10-CM | POA: Diagnosis not present

## 2018-04-20 DIAGNOSIS — O99320 Drug use complicating pregnancy, unspecified trimester: Secondary | ICD-10-CM | POA: Diagnosis not present

## 2018-05-18 DIAGNOSIS — O358XX1 Maternal care for other (suspected) fetal abnormality and damage, fetus 1: Secondary | ICD-10-CM | POA: Diagnosis not present

## 2018-06-06 ENCOUNTER — Other Ambulatory Visit: Payer: Self-pay

## 2018-06-06 ENCOUNTER — Inpatient Hospital Stay (HOSPITAL_COMMUNITY)
Admission: EM | Admit: 2018-06-06 | Discharge: 2018-06-06 | Disposition: A | Discharge status: Home / Self Care | Payer: Medicaid Other | Attending: Obstetrics and Gynecology | Admitting: Obstetrics and Gynecology

## 2018-06-06 ENCOUNTER — Encounter (HOSPITAL_COMMUNITY): Payer: Self-pay

## 2018-06-06 DIAGNOSIS — Z792 Long term (current) use of antibiotics: Secondary | ICD-10-CM | POA: Diagnosis not present

## 2018-06-06 DIAGNOSIS — O3432 Maternal care for cervical incompetence, second trimester: Secondary | ICD-10-CM | POA: Diagnosis not present

## 2018-06-06 DIAGNOSIS — O479 False labor, unspecified: Secondary | ICD-10-CM

## 2018-06-06 DIAGNOSIS — O368120 Decreased fetal movements, second trimester, not applicable or unspecified: Secondary | ICD-10-CM

## 2018-06-06 DIAGNOSIS — Z809 Family history of malignant neoplasm, unspecified: Secondary | ICD-10-CM | POA: Insufficient documentation

## 2018-06-06 DIAGNOSIS — Z0371 Encounter for suspected problem with amniotic cavity and membrane ruled out: Secondary | ICD-10-CM | POA: Insufficient documentation

## 2018-06-06 DIAGNOSIS — Z3A26 26 weeks gestation of pregnancy: Secondary | ICD-10-CM

## 2018-06-06 DIAGNOSIS — O4702 False labor before 37 completed weeks of gestation, second trimester: Secondary | ICD-10-CM | POA: Diagnosis not present

## 2018-06-06 DIAGNOSIS — Z3689 Encounter for other specified antenatal screening: Secondary | ICD-10-CM

## 2018-06-06 DIAGNOSIS — O4700 False labor before 37 completed weeks of gestation, unspecified trimester: Secondary | ICD-10-CM

## 2018-06-06 LAB — URINALYSIS, ROUTINE W REFLEX MICROSCOPIC
Bacteria, UA: NONE SEEN
Bilirubin Urine: NEGATIVE
Glucose, UA: NEGATIVE mg/dL
Hgb urine dipstick: NEGATIVE
Ketones, ur: NEGATIVE mg/dL
Nitrite: NEGATIVE
Protein, ur: NEGATIVE mg/dL
Specific Gravity, Urine: 1.023 (ref 1.005–1.030)
pH: 8 (ref 5.0–8.0)

## 2018-06-06 LAB — WET PREP, GENITAL
Clue Cells Wet Prep HPF POC: NONE SEEN
Sperm: NONE SEEN
Trich, Wet Prep: NONE SEEN

## 2018-06-06 LAB — AMNISURE RUPTURE OF MEMBRANE (ROM) NOT AT ARMC: Amnisure ROM: NEGATIVE

## 2018-06-06 MED ORDER — TERCONAZOLE 0.4 % VA CREA: 1.0000 | TOPICAL_CREAM | Freq: Every day | VAGINAL | 0 refills | Status: AC

## 2018-06-06 NOTE — ED Notes (Signed)
Transport called to transport pt. Called [facility] Joni Reining, RN for report.

## 2018-06-06 NOTE — MAU Note (Signed)
Sara Arroyo is a 28 y.o. at [redacted]w[redacted]d here in [facility] reporting: sharp pains for the last 2 days. States it radiates from the top of her abdomen down into her pelvis. Reports vaginal discharge, states it is clear, no odor. Is changing a pad 3-4 times a day. No bleeding. Reports decreased fetal movement over the past few weeks.   Onset of complaint: ongoing  Pain score: 5/10  Vitals:   06/06/18 1451 06/06/18 1525  BP: (!) 98/48 (!) 100/43  Pulse: 76 82  Resp: 18 18  Temp: 98.4 F (36.9 C) 98.9 F (37.2 C)  SpO2: 99% 100%     FHT: 125  Lab orders placed from triage:

## 2018-06-06 NOTE — Discharge Instructions (Signed)
Premature Rupture and Preterm Premature Rupture of Membranes  Rupture of membranes is when the membranes (amniotic sac) that hold your baby break open. This is commonly referred to as your "water breaking." If your water breaks before labor starts (prematurely), it is called premature rupture of membranes (PROM). If PROM occurs before 37 weeks of pregnancy, it is called preterm premature rupture of membranes (PPROM). Because the amniotic sac keeps infection out and performs other important functions, having the amniotic sac rupture before 37 weeks of pregnancy can lead to serious problems. It requires immediate attention from a health care provider. What are the causes? When PROM occurs at 37 weeks of pregnancy or later, it is usually caused by natural weakening of the membranes and friction caused by contractions. PPROM is usually caused by infection. In many cases, the cause is not known. What increases the risk of PPROM? The following factors may make you more likely to have PPROM:  Infection.  Having had PPROM in a previous pregnancy.  Short cervical length.  Bleeding during the second or third trimester.  Low BMI, which is an estimate of body fat.  Smoking.  Using drugs.  Low socioeconomic status. What problems can be caused by PROM and PPROM? This condition creates health dangers for the mother and the baby. These include:  Delivering a premature baby.  Getting a serious infection of the placental tissues (chorioamnionitis).  Early detachment of the placenta from the uterus (placental abruption).  Compression of the umbilical cord.  Developing a serious infection after delivery. What are the signs of PROM and PPROM? Signs of this condition include:  A sudden gush or slow leaking of fluid from the vagina.  Constant wet underwear. Sometimes, women mistake the leaking or wetness for urine, especially if the leak is slow and not a gush of fluid. If there is constant  leaking or if your underwear continues to get wet, your membranes have likely ruptured. What should I do if I think my membranes have ruptured?  Call your health care provider right away.  You will need to go to the hospital immediately to be checked by a health care provider. What happens if I am diagnosed with PROM or PPROM? Once you arrive at the hospital, you will have tests done. A cervical exam will be done using a lubricated instrument (speculum) to check whether the cervix has softened or started to open (dilate).  If you are diagnosed with PROM, your labor may be started for you (you may be induced) within 24 hours if you are not having contractions.  If you are diagnosed with PPROM and you are not having contractions, you may be induced depending on your trimester. If you have PPROM:  You and your baby will be monitored closely for signs of infection or other complications.  You may be given: ? An antibiotic medicine to lower the chances of developing an infection. ? A steroid medicine to help mature the baby's lungs more quickly. ? A medicine to help prevent cerebral palsy in your baby. ? A medicine to stop preterm labor.  You may be ordered to be on bed rest at home or in the hospital.  You may be induced if complications occur for you or the baby. Your treatment will depend on many factors, such as how many weeks you have been pregnant (how far along you are), the development of the baby, and other complications that may occur. This information is not intended to replace advice given to  you by your health care provider. Make sure you discuss any questions you have with your health care provider. Document Released: 01/21/2005 Document Revised: 09/06/2016 Document Reviewed: 08/28/2015 Elsevier Interactive Patient Education  2019 [employer] Fetal Movement Counts Patient Name: ________________________________________________ Patient Due Date: ____________________ What is a  fetal movement count?  A fetal movement count is the number of times that you feel your baby move during a certain amount of time. This may also be called a fetal kick count. A fetal movement count is recommended for every pregnant woman. You may be asked to start counting fetal movements as early as week 28 of your pregnancy. Pay attention to when your baby is most active. You may notice your baby's sleep and wake cycles. You may also notice things that make your baby move more. You should do a fetal movement count:  When your baby is normally most active.  At the same time each day. A good time to count movements is while you are resting, after having something to eat and drink. How do I count fetal movements? 1. Find a quiet, comfortable area. Sit, or lie down on your side. 2. Write down the date, the start time and stop time, and the number of movements that you felt between those two times. Take this information with you to your health care visits. 3. For 2 hours, count kicks, flutters, swishes, rolls, and jabs. You should feel at least 10 movements during 2 hours. 4. You may stop counting after you have felt 10 movements. 5. If you do not feel 10 movements in 2 hours, have something to eat and drink. Then, keep resting and counting for 1 hour. If you feel at least 4 movements during that hour, you may stop counting. Contact a health care provider if:  You feel fewer than 4 movements in 2 hours.  Your baby is not moving like he or she usually does. Date: ____________ Start time: ____________ Stop time: ____________ Movements: ____________ Date: ____________ Start time: ____________ Stop time: ____________ Movements: ____________ Date: ____________ Start time: ____________ Stop time: ____________ Movements: ____________ Date: ____________ Start time: ____________ Stop time: ____________ Movements: ____________ Date: ____________ Start time: ____________ Stop time: ____________ Movements:  ____________ Date: ____________ Start time: ____________ Stop time: ____________ Movements: ____________ Date: ____________ Start time: ____________ Stop time: ____________ Movements: ____________ Date: ____________ Start time: ____________ Stop time: ____________ Movements: ____________ Date: ____________ Start time: ____________ Stop time: ____________ Movements: ____________ This information is not intended to replace advice given to you by your health care provider. Make sure you discuss any questions you have with your health care provider. Document Released: 02/20/2006 Document Revised: 09/20/2015 Document Reviewed: 03/02/2015 Elsevier Interactive Patient Education  2019 [employer]. Preterm Labor and Birth Information  The normal length of a pregnancy is 39-41 weeks. Preterm labor is when labor starts before 37 completed weeks of pregnancy. What are the risk factors for preterm labor? Preterm labor is more likely to occur in women who:  Have certain infections during pregnancy such as a bladder infection, sexually transmitted infection, or infection inside the uterus (chorioamnionitis).  Have a shorter-than-normal cervix.  Have gone into preterm labor before.  Have had surgery on their cervix.  Are younger than age 27 or older than age 86.  Are African American.  Are pregnant with twins or multiple babies (multiple gestation).  Take street drugs or smoke while pregnant.  Do not gain enough weight while pregnant.  Became pregnant shortly after having been  pregnant. What are the symptoms of preterm labor? Symptoms of preterm labor include:  Cramps similar to those that can happen during a menstrual period. The cramps may happen with diarrhea.  Pain in the abdomen or lower back.  Regular uterine contractions that may feel like tightening of the abdomen.  A feeling of increased pressure in the pelvis.  Increased watery or bloody mucus discharge from the  vagina.  Water breaking (ruptured amniotic sac). Why is it important to recognize signs of preterm labor? It is important to recognize signs of preterm labor because babies who are born prematurely may not be fully developed. This can put them at an increased risk for:  Long-term (chronic) heart and lung problems.  Difficulty immediately after birth with regulating body systems, including blood sugar, body temperature, heart rate, and breathing rate.  Bleeding in the brain.  Cerebral palsy.  Learning difficulties.  Death. These risks are highest for babies who are born before 34 weeks of pregnancy. How is preterm labor treated? Treatment depends on the length of your pregnancy, your condition, and the health of your baby. It may involve:  Having a stitch (suture) placed in your cervix to prevent your cervix from opening too early (cerclage).  Taking or being given medicines, such as: ? Hormone medicines. These may be given early in pregnancy to help support the pregnancy. ? Medicine to stop contractions. ? Medicines to help mature the babys lungs. These may be prescribed if the risk of delivery is high. ? Medicines to prevent your baby from developing cerebral palsy. If the labor happens before 34 weeks of pregnancy, you may need to stay in the hospital. What should I do if I think I am in preterm labor? If you think that you are going into preterm labor, call your health care provider right away. How can I prevent preterm labor in future pregnancies? To increase your chance of having a full-term pregnancy:  Do not use any tobacco products, such as cigarettes, chewing tobacco, and e-cigarettes. If you need help quitting, ask your health care provider.  Do not use street drugs or medicines that have not been prescribed to you during your pregnancy.  Talk with your health care provider before taking any herbal supplements, even if you have been taking them regularly.  Make sure  you gain a healthy amount of weight during your pregnancy.  Watch for infection. If you think that you might have an infection, get it checked right away.  Make sure to tell your health care provider if you have gone into preterm labor before. This information is not intended to replace advice given to you by your health care provider. Make sure you discuss any questions you have with your health care provider. Document Released: 04/13/2003 Document Revised: 07/04/2015 Document Reviewed: 06/14/2015 Elsevier Interactive Patient Education  2019 [employer]

## 2018-06-06 NOTE — MAU Provider Note (Signed)
History     CSN: [id number]  Arrival date and time: 06/06/18 1441   First Provider Initiated Contact with Patient 06/06/18 1548      Chief Complaint  Patient presents with  . Abdominal Pain  . Vaginal Discharge  . Decreased Fetal Movement   [name] is a 28 y.o. G3P1011 at [redacted]w[redacted]d who presents to MAU for abdominal pain and absence of fetal movement for the past 2-3days. Pt also reports clear, odorless vaginal discharge that requires 3-4 menstrual pads that become completely soaked along with partially soaking her clothes. Pt reports taking multiple showers a day d/t the leakage of fluids. Pt has been seen regularly by [facility] for prenatal care. Pt reports working two jobs at [employer].  Onset: 2days ago Location: started in upper abdomen two days ago, now is isolated to pelvic region Duration: 2days Character: constant, "like I'm trying to push out a baby," pain is always present, but can get worse at times Aggravating/Associated: none/none Relieving: none Treatment: none Severity: 5/10  Pt denies VB, ctx, vaginal odor/itching. Pt denies N/V, constipation, diarrhea, or urinary problems. Pt denies fever, chills, fatigue, sweating or changes in appetite. Pt denies SOB or chest pain. Pt denies dizziness, HA, light-headedness, weakness.  Problems this pregnancy include: none. Allergies? NKDA Current medications/supplements? PNVs Prenatal care provider? [facility], next appt 06/18/2018   OB History    Gravida  3   Para  1   Term  1   Preterm      AB  1   Living  1     SAB      TAB  1   Ectopic      Multiple      Live Births              Past Medical History:  Diagnosis Date  . Abnormal Pap smear   . Hypokalemia     Past Surgical History:  Procedure Laterality Date  . COLPOSCOPY  10/2011    Family History  Problem Relation Age of Onset  . Cancer Father     Social History   Tobacco Use  . Smoking status: Never Smoker  .  Smokeless tobacco: Never Used  Substance Use Topics  . Alcohol use: No  . Drug use: No    Allergies: No Known Allergies  Medications Prior to Admission  Medication Sig Dispense Refill Last Dose  . clindamycin (CLEOCIN) 150 MG capsule Take 3 capsules (450 mg total) by mouth 3 (three) times daily. (Patient not taking: Reported on 01/29/2018) 63 capsule 0 Not Taking at Unknown time  . doxycycline (VIBRAMYCIN) 100 MG capsule Take 1 capsule (100 mg total) by mouth 2 (two) times daily. One po bid x 7 days (Patient not taking: Reported on 01/29/2018) 14 capsule 0 Not Taking at Unknown time  . ibuprofen (ADVIL,MOTRIN) 600 MG tablet Take 1 tablet (600 mg total) by mouth every 6 (six) hours as needed. (Patient not taking: Reported on 01/29/2018) 30 tablet 0 Not Taking at Unknown time  . lidocaine (LIDODERM) 5 % Place 1 patch onto the skin daily. Remove & Discard patch within 12 hours or as directed by MD (Patient not taking: Reported on 01/29/2018) 30 patch 0 Not Taking at Unknown time  . meloxicam (MOBIC) 15 MG tablet Take 1 tablet (15 mg total) by mouth daily. (Patient not taking: Reported on 01/29/2018) 20 tablet 0 Not Taking at Unknown time  . methocarbamol (ROBAXIN) 500 MG tablet Take 1 tablet (500  mg total) by mouth 2 (two) times daily. (Patient not taking: Reported on 01/29/2018) 20 tablet 0 Not Taking at Unknown time  . metoCLOPramide (REGLAN) 10 MG tablet Take 1 tablet (10 mg total) by mouth every 6 (six) hours. 20 tablet 0 Has not picked up rx yet  . metroNIDAZOLE (FLAGYL) 500 MG tablet Take 1 tablet (500 mg total) by mouth 2 (two) times daily. 14 tablet 0   . ondansetron (ZOFRAN ODT) 8 MG disintegrating tablet Take 1 tablet (8 mg total) by mouth every 8 (eight) hours as needed for nausea or vomiting. 20 tablet 0   . ondansetron (ZOFRAN) 4 MG tablet Take 1 tablet (4 mg total) by mouth every 8 (eight) hours as needed for nausea or vomiting. (Patient not taking: Reported on 09/10/2017) 15 tablet 0 Not  Taking at Unknown time  . Saccharomyces boulardii (PROBIOTIC) 250 MG CAPS Take 1 capsule by mouth daily. 15 capsule 0 Has not picked up rx yet    Review of Systems  Constitutional: Negative for appetite change, chills, diaphoresis, fatigue and fever.  Respiratory: Negative for shortness of breath.   Cardiovascular: Negative for chest pain.  Gastrointestinal: Positive for abdominal pain. Negative for constipation, diarrhea, nausea and vomiting.  Genitourinary: Positive for pelvic pain and vaginal discharge. Negative for dysuria, flank pain, frequency, urgency and vaginal bleeding.  Musculoskeletal: Negative for back pain.  Neurological: Negative for dizziness, weakness, light-headedness and headaches.   Physical Exam   Blood pressure (!) 101/57, pulse 68, temperature (!) 97.5 F (36.4 C), temperature source Oral, resp. rate 18, height  (1.549 m), weight 62.7 kg, last menstrual period 12/17/2017, SpO2 100 %.  Patient Vitals for the past 24 hrs:  BP Temp Temp src Pulse Resp SpO2 Height Weight  06/06/18 1749 (!) 101/57 - - 68 - - - -  06/06/18 1555 - (!) 97.5 F (36.4 C) Oral - - - - -  06/06/18 1541 (!) 100/45 - - 68 - - - -  06/06/18 1525 (!) 100/43 98.9 F (37.2 C) Oral 82 18 100 % - -  06/06/18 1520 - - - - - -  (1.549 m) 62.7 kg  06/06/18 1451 (!) 98/48 98.4 F (36.9 C) Oral 76 18 99 % - -   Physical Exam  Constitutional: She is oriented to person, place, and time. She appears well-developed and well-nourished. No distress.  HENT:  Head: Normocephalic and atraumatic.  Respiratory: Effort normal.  GI: Soft. She exhibits no distension and no mass. There is no abdominal tenderness. There is no rebound and no guarding.  Genitourinary: There is no rash, tenderness or lesion on the right labia. There is no rash, tenderness or lesion on the left labia. Uterus is not tender. Cervix exhibits no motion tenderness, no discharge and no friability.    Vaginal discharge (thick, white  with yellow-tinged vaginal discharge) present.     No vaginal tenderness or bleeding.  No tenderness or bleeding in the vagina.    Genitourinary Comments: On SSE, cervix appears visually closed, no pooling of fluid, on bimanual exam, cervix loose fingertip/soft/midline/long   Neurological: She is alert and oriented to person, place, and time.  Skin: Skin is warm and dry. She is not diaphoretic.  Psychiatric: She has a normal mood and affect. Her behavior is normal. Judgment and thought content normal.   Results for orders placed or performed during the hospital encounter of 06/06/18 (from the past 24 hour(s))  Urinalysis, Routine w reflex microscopic  Status: Abnormal   Collection Time: 06/06/18  3:33 PM  Result Value Ref Range   Color, Urine YELLOW YELLOW   APPearance CLEAR CLEAR   Specific Gravity, Urine 1.023 1.005 - 1.030   pH 8.0 5.0 - 8.0   Glucose, UA NEGATIVE NEGATIVE mg/dL   Hgb urine dipstick NEGATIVE NEGATIVE   Bilirubin Urine NEGATIVE NEGATIVE   Ketones, ur NEGATIVE NEGATIVE mg/dL   Protein, ur NEGATIVE NEGATIVE mg/dL   Nitrite NEGATIVE NEGATIVE   Leukocytes,Ua TRACE (A) NEGATIVE   RBC / HPF 0-5 0 - 5 RBC/hpf   WBC, UA 0-5 0 - 5 WBC/hpf   Bacteria, UA NONE SEEN NONE SEEN   Squamous Epithelial / LPF 6-10 0 - 5   Mucus PRESENT   Wet prep, genital     Status: Abnormal   Collection Time: 06/06/18  4:17 PM  Result Value Ref Range   Yeast Wet Prep HPF POC PRESENT (A) NONE SEEN   Trich, Wet Prep NONE SEEN NONE SEEN   Clue Cells Wet Prep HPF POC NONE SEEN NONE SEEN   WBC, Wet Prep HPF POC MANY (A) NONE SEEN   Sperm NONE SEEN   Amnisure rupture [facility] membrane (rom)not at [facility]     Status: None   Collection Time: 06/06/18  4:26 PM  Result Value Ref Range   Amnisure ROM NEGATIVE    No results found.  MAU Course  Procedures  MDM -r/o PTL       -unable to perform fFN d/t pt report of daily douching with fingers inside the vagina       -UA: trace leuks, otherwise WNL,  sending for culture d/t pt sx       -Initial CE: loose fingertip/midline/soft/long       -no ctx on monitor, no palpable ctx       -no nifedipine given d/t low BP       -CL not performed d/t COVID 19 US guidelines provided by MFM       -WetPrep: +yeast/WBCs, otherwise WNL       -GC/CT collected       -repeat CE: pt declines       -betamethasone: pt declines -r/o PPROM       -on SSE, no pooling of fluid, thick white with yellow-tinged vaginal discharge, no fluid leaking         from os       -Fern: negative       -Amnisure: negative -DFM       -EFM: reactive with few variables             -baseline: 140             -variability: moderate             -accels: present, 10x10             -decels: present, few variable             -TOCO: no ctx, uterine irritability noted -when provider reentered the room to discuss results of testing and next steps, pt was already dressed and questioning how much longer this would be because she "is ready to leave and hungry and thirsty because she hasn't eaten all day." Of note, pt was offered PO hydration with a pitcher of water at the beginning of her visit for uterine irritability. Pt reported to the nurse that she drank the water, but told the provider that she could not drink the water. Provider reviewed the  above results in detail and reiterated the importance of a repeat cervical exam and betamethasone injection multiple times and provider expressed to pt that she felt the repeat exam and betamethasone injections were an extremely important part of her care. Provider also discussed possible courses of action based on the results of a repeat cervical exam including inpatient admission for observation for PTL and inpatient/outpatient betamethasone. Pt was given an opportunity to answer questions and questions were asked and answered. At the end of the conversation, pt still declines repeat cervical exam and betamethasone injections, despite verbalizing  understanding of their importance and despite reporting that her symptoms were unchanged since admission. Provider expressed to pt that without repeat exam, she could not be sure that the patient was not actively in preterm labor and could not be sure that her symptoms were not representative of active preterm labor. Pt continues to decline repeat cervical exam and betamethasone. -pt discharged to home in stable condition, but pt reports upon discharge that her symptoms were unchanged since admission  Orders Placed This Encounter  Procedures  . Wet prep, genital    Standing Status:   Standing    Number of Occurrences:   1  . Culture, OB Urine    Standing Status:   Standing    Number of Occurrences:   1  . Urinalysis, Routine w reflex microscopic    Standing Status:   Standing    Number of Occurrences:   1  . Amnisure rupture of membrane (rom)not at [facility]    Standing Status:   Standing    Number of Occurrences:   1  . Discharge patient    Order Specific Question:   Discharge disposition    Answer:   01-Home or Self Care [1]    Order Specific Question:   Discharge patient date    Answer:   06/06/2018   Meds ordered this encounter  Medications  . terconazole (TERAZOL 7) 0.4 % vaginal cream    Sig: Place 1 applicator vaginally at bedtime for 7 days.    Dispense:  45 g    Refill:  0    Order Specific Question:   Supervising Provider    Answer:   CONSTANT, PEGGY [[id number]]   Assessment and Plan   1. Encounter for suspected premature rupture of amniotic membranes, with rupture of membranes not found   2. [redacted] weeks gestation of pregnancy   3. NST (non-stress test) reactive   4. Preterm contractions   5. Premature cervical dilation in second trimester   6. Decreased fetal movements in second trimester, single or unspecified fetus    Allergies as of 06/06/2018   No Known Allergies     Medication List    STOP taking these medications   clindamycin 150 MG capsule Commonly known as:   CLEOCIN   doxycycline 100 MG capsule Commonly known as:  VIBRAMYCIN   ibuprofen 600 MG tablet Commonly known as:  ADVIL   meloxicam 15 MG tablet Commonly known as:  MOBIC   Probiotic 250 MG Caps     TAKE these medications   lidocaine 5 % Commonly known as:  Lidoderm Place 1 patch onto the skin daily. Remove & Discard patch within 12 hours or as directed by MD   methocarbamol 500 MG tablet Commonly known as:  ROBAXIN Take 1 tablet (500 mg total) by mouth 2 (two) times daily.   metoCLOPramide 10 MG tablet Commonly known as:  REGLAN Take 1 tablet (10 mg total) by  mouth every 6 (six) hours.   metroNIDAZOLE 500 MG tablet Commonly known as:  FLAGYL Take 1 tablet (500 mg total) by mouth 2 (two) times daily.   ondansetron 4 MG tablet Commonly known as:  ZOFRAN Take 1 tablet (4 mg total) by mouth every 8 (eight) hours as needed for nausea or vomiting.   ondansetron 8 MG disintegrating tablet Commonly known as:  Zofran ODT Take 1 tablet (8 mg total) by mouth every 8 (eight) hours as needed for nausea or vomiting.   terconazole 0.4 % vaginal cream Commonly known as:  TERAZOL 7 Place 1 applicator vaginally at bedtime for 7 days.       -discussed appropriate hydration and nutrition requirements during pregnancy -discussed s/sx of low blood sugar -discussed reasons for preterm ctx including UTI/dehydration -discussed normal fetal movement in pregnancy and FKCs starting  -discussed s/sx of round ligament pain and ways to avoid -discouraged douching, advised pelvic rest in case of readmission to MAU so fFN can be performed -RX given for yeast infection -strict PTL precautions/return MAU precautions -will call with culture results, if positive -pt discharged to home in stable condition  Joni Reining E Richetta Cubillos 06/06/2018, 5:57 PM

## 2018-06-08 LAB — CULTURE, OB URINE: Culture: <10000 — AB

## 2018-06-08 LAB — GC/CHLAMYDIA PROBE AMP (~~LOC~~) NOT AT ARMC
Chlamydia: NEGATIVE
Neisseria Gonorrhea: NEGATIVE

## 2018-06-18 DIAGNOSIS — Z3483 Encounter for supervision of other normal pregnancy, third trimester: Secondary | ICD-10-CM | POA: Diagnosis not present

## 2018-06-18 DIAGNOSIS — O99330 Smoking (tobacco) complicating pregnancy, unspecified trimester: Secondary | ICD-10-CM | POA: Diagnosis not present

## 2018-06-18 DIAGNOSIS — Z8742 Personal history of other diseases of the female genital tract: Secondary | ICD-10-CM | POA: Diagnosis not present

## 2018-06-18 DIAGNOSIS — Z789 Other specified health status: Secondary | ICD-10-CM | POA: Diagnosis not present

## 2018-06-18 DIAGNOSIS — Z1388 Encounter for screening for disorder due to exposure to contaminants: Secondary | ICD-10-CM | POA: Diagnosis not present

## 2018-06-18 DIAGNOSIS — O99320 Drug use complicating pregnancy, unspecified trimester: Secondary | ICD-10-CM | POA: Diagnosis not present

## 2018-06-18 DIAGNOSIS — Z8759 Personal history of other complications of pregnancy, childbirth and the puerperium: Secondary | ICD-10-CM | POA: Diagnosis not present

## 2018-06-18 DIAGNOSIS — Z3009 Encounter for other general counseling and advice on contraception: Secondary | ICD-10-CM | POA: Diagnosis not present

## 2018-06-18 DIAGNOSIS — Z0389 Encounter for observation for other suspected diseases and conditions ruled out: Secondary | ICD-10-CM | POA: Diagnosis not present

## 2018-06-18 DIAGNOSIS — L282 Other prurigo: Secondary | ICD-10-CM | POA: Diagnosis not present

## 2018-07-03 DIAGNOSIS — Z789 Other specified health status: Secondary | ICD-10-CM | POA: Diagnosis not present

## 2018-07-03 DIAGNOSIS — Z3483 Encounter for supervision of other normal pregnancy, third trimester: Secondary | ICD-10-CM | POA: Diagnosis not present

## 2018-07-03 DIAGNOSIS — Z8759 Personal history of other complications of pregnancy, childbirth and the puerperium: Secondary | ICD-10-CM | POA: Diagnosis not present

## 2018-07-03 DIAGNOSIS — L282 Other prurigo: Secondary | ICD-10-CM | POA: Diagnosis not present

## 2018-07-03 DIAGNOSIS — O99320 Drug use complicating pregnancy, unspecified trimester: Secondary | ICD-10-CM | POA: Diagnosis not present

## 2018-07-03 DIAGNOSIS — O99330 Smoking (tobacco) complicating pregnancy, unspecified trimester: Secondary | ICD-10-CM | POA: Diagnosis not present

## 2018-07-03 DIAGNOSIS — Z8742 Personal history of other diseases of the female genital tract: Secondary | ICD-10-CM | POA: Diagnosis not present

## 2018-08-13 DIAGNOSIS — Z8742 Personal history of other diseases of the female genital tract: Secondary | ICD-10-CM | POA: Diagnosis not present

## 2018-08-13 DIAGNOSIS — O99320 Drug use complicating pregnancy, unspecified trimester: Secondary | ICD-10-CM | POA: Diagnosis not present

## 2018-08-13 DIAGNOSIS — L282 Other prurigo: Secondary | ICD-10-CM | POA: Diagnosis not present

## 2018-08-13 DIAGNOSIS — Z8759 Personal history of other complications of pregnancy, childbirth and the puerperium: Secondary | ICD-10-CM | POA: Diagnosis not present

## 2018-08-13 DIAGNOSIS — Z3483 Encounter for supervision of other normal pregnancy, third trimester: Secondary | ICD-10-CM | POA: Diagnosis not present

## 2018-08-13 DIAGNOSIS — Z789 Other specified health status: Secondary | ICD-10-CM | POA: Diagnosis not present

## 2018-08-13 DIAGNOSIS — O99330 Smoking (tobacco) complicating pregnancy, unspecified trimester: Secondary | ICD-10-CM | POA: Diagnosis not present

## 2018-08-13 LAB — OB RESULTS CONSOLE GC/CHLAMYDIA
Chlamydia: NEGATIVE
Gonorrhea: NEGATIVE

## 2018-08-13 LAB — OB RESULTS CONSOLE GBS: GBS: NEGATIVE

## 2018-09-03 ENCOUNTER — Other Ambulatory Visit: Payer: Self-pay

## 2018-09-03 ENCOUNTER — Encounter (HOSPITAL_COMMUNITY): Payer: Self-pay | Admitting: *Deleted

## 2018-09-03 ENCOUNTER — Inpatient Hospital Stay (HOSPITAL_COMMUNITY)
Admission: EM | Admit: 2018-09-03 | Discharge: 2018-09-04 | Disposition: A | Discharge status: Home / Self Care | Payer: Medicaid Other | Source: Home / Self Care | Admitting: Obstetrics & Gynecology

## 2018-09-03 DIAGNOSIS — O99330 Smoking (tobacco) complicating pregnancy, unspecified trimester: Secondary | ICD-10-CM | POA: Diagnosis not present

## 2018-09-03 DIAGNOSIS — Z789 Other specified health status: Secondary | ICD-10-CM | POA: Diagnosis not present

## 2018-09-03 DIAGNOSIS — L282 Other prurigo: Secondary | ICD-10-CM | POA: Diagnosis not present

## 2018-09-03 DIAGNOSIS — Z3A39 39 weeks gestation of pregnancy: Secondary | ICD-10-CM | POA: Insufficient documentation

## 2018-09-03 DIAGNOSIS — O471 False labor at or after 37 completed weeks of gestation: Secondary | ICD-10-CM | POA: Insufficient documentation

## 2018-09-03 DIAGNOSIS — Z3483 Encounter for supervision of other normal pregnancy, third trimester: Secondary | ICD-10-CM | POA: Diagnosis not present

## 2018-09-03 DIAGNOSIS — O479 False labor, unspecified: Secondary | ICD-10-CM

## 2018-09-03 DIAGNOSIS — O99320 Drug use complicating pregnancy, unspecified trimester: Secondary | ICD-10-CM | POA: Diagnosis not present

## 2018-09-03 DIAGNOSIS — Z8742 Personal history of other diseases of the female genital tract: Secondary | ICD-10-CM | POA: Diagnosis not present

## 2018-09-03 DIAGNOSIS — Z8759 Personal history of other complications of pregnancy, childbirth and the puerperium: Secondary | ICD-10-CM | POA: Diagnosis not present

## 2018-09-03 NOTE — MAU Note (Signed)
Seen in office this am. Ctx stronger and closer. 3.5cm this am. Some mucousy d/c but states panties are staying wet for wk and a half

## 2018-09-04 ENCOUNTER — Inpatient Hospital Stay (HOSPITAL_COMMUNITY): Payer: Medicaid Other | Admitting: Anesthesiology

## 2018-09-04 ENCOUNTER — Inpatient Hospital Stay (HOSPITAL_COMMUNITY)
Admission: AD | Admit: 2018-09-04 | Discharge: 2018-09-06 | DRG: 807 | Disposition: A | Discharge status: Home / Self Care | Payer: Medicaid Other | Attending: Family Medicine | Admitting: Family Medicine

## 2018-09-04 ENCOUNTER — Encounter (HOSPITAL_COMMUNITY): Payer: Self-pay

## 2018-09-04 ENCOUNTER — Other Ambulatory Visit: Payer: Self-pay

## 2018-09-04 DIAGNOSIS — O26893 Other specified pregnancy related conditions, third trimester: Secondary | ICD-10-CM | POA: Diagnosis present

## 2018-09-04 DIAGNOSIS — Z3A39 39 weeks gestation of pregnancy: Secondary | ICD-10-CM | POA: Diagnosis not present

## 2018-09-04 DIAGNOSIS — Z20828 Contact with and (suspected) exposure to other viral communicable diseases: Secondary | ICD-10-CM | POA: Diagnosis present

## 2018-09-04 DIAGNOSIS — O99824 Streptococcus B carrier state complicating childbirth: Secondary | ICD-10-CM | POA: Diagnosis not present

## 2018-09-04 DIAGNOSIS — F1290 Cannabis use, unspecified, uncomplicated: Secondary | ICD-10-CM | POA: Diagnosis present

## 2018-09-04 DIAGNOSIS — O99324 Drug use complicating childbirth: Principal | ICD-10-CM | POA: Diagnosis present

## 2018-09-04 DIAGNOSIS — Z3A00 Weeks of gestation of pregnancy not specified: Secondary | ICD-10-CM | POA: Diagnosis not present

## 2018-09-04 LAB — RAPID URINE DRUG SCREEN, HOSP PERFORMED
Amphetamines: NOT DETECTED
Barbiturates: NOT DETECTED
Benzodiazepines: NOT DETECTED
Cocaine: NOT DETECTED
Opiates: NOT DETECTED
Tetrahydrocannabinol: POSITIVE — AB

## 2018-09-04 LAB — RPR: RPR Ser Ql: NONREACTIVE

## 2018-09-04 LAB — CBC
HCT: 42.6 % (ref 36.0–46.0)
Hemoglobin: 15.3 g/dL — ABNORMAL HIGH (ref 12.0–15.0)
MCH: 32.9 pg (ref 26.0–34.0)
MCHC: 35.9 g/dL (ref 30.0–36.0)
MCV: 91.6 fL (ref 80.0–100.0)
Platelets: 159 10*3/uL (ref 150–400)
RBC: 4.65 MIL/uL (ref 3.87–5.11)
RDW: 12.3 % (ref 11.5–15.5)
WBC: 10.7 10*3/uL — ABNORMAL HIGH (ref 4.0–10.5)
nRBC: 0 % (ref 0.0–0.2)

## 2018-09-04 LAB — ABO/RH: ABO/RH(D): O POS

## 2018-09-04 LAB — TYPE AND SCREEN
ABO/RH(D): O POS
Antibody Screen: NEGATIVE

## 2018-09-04 LAB — SARS CORONAVIRUS 2 BY RT PCR (HOSPITAL ORDER, PERFORMED IN ~~LOC~~ HOSPITAL LAB): SARS Coronavirus 2: NEGATIVE

## 2018-09-04 MED ORDER — SODIUM CHLORIDE (PF) 0.9 % IJ SOLN
INTRAMUSCULAR | Status: DC | PRN
  Administered 2018-09-04: 12 mL/h via EPIDURAL

## 2018-09-04 MED ORDER — FLEET ENEMA 7-19 GM/118ML RE ENEM: 1.0000 | ENEMA | RECTAL | Status: DC | PRN

## 2018-09-04 MED ORDER — ONDANSETRON HCL 4 MG PO TABS: 4.0000 mg | ORAL_TABLET | ORAL | Status: DC | PRN

## 2018-09-04 MED ORDER — OXYTOCIN 40 UNITS IN NORMAL SALINE INFUSION - SIMPLE MED
2.5000 [IU]/h | INTRAVENOUS | Status: DC
  Filled 2018-09-04: qty 1000

## 2018-09-04 MED ORDER — IBUPROFEN 600 MG PO TABS
600.0000 mg | ORAL_TABLET | Freq: Four times a day (QID) | ORAL | Status: DC
  Administered 2018-09-04 – 2018-09-06 (×7): 600 mg via ORAL
  Filled 2018-09-04 (×7): qty 1

## 2018-09-04 MED ORDER — SIMETHICONE 80 MG PO CHEW: 80.0000 mg | CHEWABLE_TABLET | ORAL | Status: DC | PRN

## 2018-09-04 MED ORDER — BENZOCAINE-MENTHOL 20-0.5 % EX AERO: 1.0000 "application " | INHALATION_SPRAY | CUTANEOUS | Status: DC | PRN

## 2018-09-04 MED ORDER — ONDANSETRON HCL 4 MG/2ML IJ SOLN: 4.0000 mg | Freq: Four times a day (QID) | INTRAMUSCULAR | Status: DC | PRN

## 2018-09-04 MED ORDER — LIDOCAINE HCL (PF) 1 % IJ SOLN: 30.0000 mL | INTRAMUSCULAR | Status: DC | PRN

## 2018-09-04 MED ORDER — OXYCODONE-ACETAMINOPHEN 5-325 MG PO TABS: 2.0000 | ORAL_TABLET | ORAL | Status: DC | PRN

## 2018-09-04 MED ORDER — FENTANYL CITRATE (PF) 100 MCG/2ML IJ SOLN
100.0000 ug | INTRAMUSCULAR | Status: DC | PRN
  Administered 2018-09-04 (×2): 100 ug via INTRAVENOUS
  Filled 2018-09-04: qty 2

## 2018-09-04 MED ORDER — WITCH HAZEL-GLYCERIN EX PADS: 1.0000 "application " | MEDICATED_PAD | CUTANEOUS | Status: DC | PRN

## 2018-09-04 MED ORDER — FENTANYL CITRATE (PF) 100 MCG/2ML IJ SOLN
INTRAMUSCULAR | Status: AC
  Filled 2018-09-04: qty 2

## 2018-09-04 MED ORDER — LIDOCAINE HCL (PF) 1 % IJ SOLN
INTRAMUSCULAR | Status: DC | PRN
  Administered 2018-09-04 (×2): 4 mL via EPIDURAL

## 2018-09-04 MED ORDER — OXYCODONE-ACETAMINOPHEN 5-325 MG PO TABS: 1.0000 | ORAL_TABLET | ORAL | Status: DC | PRN

## 2018-09-04 MED ORDER — SENNOSIDES-DOCUSATE SODIUM 8.6-50 MG PO TABS
2.0000 | ORAL_TABLET | ORAL | Status: DC
  Administered 2018-09-05 – 2018-09-06 (×2): 2 via ORAL
  Filled 2018-09-04 (×2): qty 2

## 2018-09-04 MED ORDER — DIPHENHYDRAMINE HCL 25 MG PO CAPS: 25.0000 mg | ORAL_CAPSULE | Freq: Four times a day (QID) | ORAL | Status: DC | PRN

## 2018-09-04 MED ORDER — LACTATED RINGERS IV SOLN: 500.0000 mL | Freq: Once | INTRAVENOUS | Status: DC

## 2018-09-04 MED ORDER — OXYTOCIN 40 UNITS IN NORMAL SALINE INFUSION - SIMPLE MED
1.0000 m[IU]/min | INTRAVENOUS | Status: DC
  Administered 2018-09-04: 2 m[IU]/min via INTRAVENOUS

## 2018-09-04 MED ORDER — LACTATED RINGERS IV SOLN
INTRAVENOUS | Status: DC
  Administered 2018-09-04: 09:00:00 via INTRAVENOUS

## 2018-09-04 MED ORDER — SOD CITRATE-CITRIC ACID 500-334 MG/5ML PO SOLN: 30.0000 mL | ORAL | Status: DC | PRN

## 2018-09-04 MED ORDER — COCONUT OIL OIL: 1.0000 "application " | TOPICAL_OIL | Status: DC | PRN

## 2018-09-04 MED ORDER — PRENATAL MULTIVITAMIN CH
1.0000 | ORAL_TABLET | Freq: Every day | ORAL | Status: DC
  Administered 2018-09-05: 1 via ORAL
  Filled 2018-09-04: qty 1

## 2018-09-04 MED ORDER — OXYTOCIN BOLUS FROM INFUSION: 500.0000 mL | Freq: Once | INTRAVENOUS | Status: DC

## 2018-09-04 MED ORDER — FENTANYL-BUPIVACAINE-NACL 0.5-0.125-0.9 MG/250ML-% EP SOLN
EPIDURAL | Status: AC
  Filled 2018-09-04: qty 250

## 2018-09-04 MED ORDER — ACETAMINOPHEN 325 MG PO TABS
650.0000 mg | ORAL_TABLET | ORAL | Status: DC | PRN
  Administered 2018-09-04: 650 mg via ORAL
  Filled 2018-09-04 (×2): qty 2

## 2018-09-04 MED ORDER — DIBUCAINE (PERIANAL) 1 % EX OINT: 1.0000 "application " | TOPICAL_OINTMENT | CUTANEOUS | Status: DC | PRN

## 2018-09-04 MED ORDER — TETANUS-DIPHTH-ACELL PERTUSSIS 5-2.5-18.5 LF-MCG/0.5 IM SUSP: 0.5000 mL | Freq: Once | INTRAMUSCULAR | Status: DC

## 2018-09-04 MED ORDER — ONDANSETRON HCL 4 MG/2ML IJ SOLN: 4.0000 mg | INTRAMUSCULAR | Status: DC | PRN

## 2018-09-04 MED ORDER — TERBUTALINE SULFATE 1 MG/ML IJ SOLN
0.2500 mg | Freq: Once | INTRAMUSCULAR | Status: DC | PRN
  Filled 2018-09-04: qty 1

## 2018-09-04 MED ORDER — ACETAMINOPHEN 325 MG PO TABS: 650.0000 mg | ORAL_TABLET | ORAL | Status: DC | PRN

## 2018-09-04 MED ORDER — LACTATED RINGERS IV SOLN: 500.0000 mL | INTRAVENOUS | Status: DC | PRN

## 2018-09-04 NOTE — Anesthesia Preprocedure Evaluation (Signed)
Anesthesia Evaluation  Patient identified by MRN, date of birth, ID band Patient awake    Reviewed: Allergy & Precautions, Patient's Chart, lab work & pertinent test results  History of Anesthesia Complications Negative for: history of anesthetic complications  Airway Mallampati: II  TM Distance: >3 FB Neck ROM: Full    Dental no notable dental hx.    Pulmonary neg pulmonary ROS,    Pulmonary exam normal        Cardiovascular negative cardio ROS Normal cardiovascular exam     Neuro/Psych negative neurological ROS     GI/Hepatic negative GI ROS, Neg liver ROS,   Endo/Other  negative endocrine ROS  Renal/GU negative Renal ROS     Musculoskeletal negative musculoskeletal ROS (+)   Abdominal   Peds  Hematology negative hematology ROS (+)   Anesthesia Other Findings Day of surgery medications reviewed with the patient.  Reproductive/Obstetrics (+) Pregnancy                             Anesthesia Physical Anesthesia Plan  ASA: II  Anesthesia Plan: Epidural   Post-op Pain Management:    Induction:   PONV Risk Score and Plan: Treatment may vary due to age or medical condition  Airway Management Planned: Natural Airway  Additional Equipment:   Intra-op Plan:   Post-operative Plan:   Informed Consent: I have reviewed the patients History and Physical, chart, labs and discussed the procedure including the risks, benefits and alternatives for the proposed anesthesia with the patient or authorized representative who has indicated his/her understanding and acceptance.       Plan Discussed with:   Anesthesia Plan Comments:         Anesthesia Quick Evaluation  

## 2018-09-04 NOTE — Progress Notes (Signed)
Dr Janus Molder notified of pt's admission and status. Aware of sve, ctx pattern, pt requesting epidural. Will admit to [facility]

## 2018-09-04 NOTE — Discharge Summary (Signed)
Obstetrics Discharge Summary OB/GYN Faculty Practice   Patient Name: Sara Arroyo DOB: [birth year] MRN: [id number]  Date of admission: 09/04/2018 Delivering MD: Lesly DukesLEGGETT, KELLY H   Date of discharge: 09/06/2018  Admitting diagnosis: CTX Intrauterine pregnancy: 926w4d     Secondary diagnosis:   Active Problems:   Labor without complication   Additional problems:  --+ THC on admission    Discharge diagnosis: Term Pregnancy Delivered                                            Postpartum procedures: None  Complications: None  Outpatient Follow-Up: --[facility] in 4-6 weeks  Hospital course: Sara Arroyo is a 28 y.o. 5526w4d who was admitted for SOL. Her pregnancy was complicated by nicotine and marijuana use. Her labor course was notable for SOL. Delivery was uncomplicated. Please see delivery/op note for additional details. Her postpartum course was uncomplicated. By day of discharge, she was passing flatus, urinating, eating and drinking without difficulty. Her pain was well-controlled, and she was discharged home with pain medication. She will follow-up in clinic in 4 weeks.   Physical exam  Vitals:   09/05/18 0530 09/05/18 0924 09/05/18 2147 09/06/18 0609  BP: 107/63 108/68 117/66 111/62  Pulse: 67 62 (!) 58 (!) 59  Resp: 16 16 17 16   Temp:  98 F (36.7 C) 98.7 F (37.1 C) 97.7 F (36.5 C)  TempSrc:  Oral Oral Oral  SpO2: 99%      General: Alert and oriented and in no acute distress Lochia: appropriate Uterine Fundus: firm Incision: N/A DVT Evaluation: No significant calf/ankle edema. Labs: Lab Results  Component Value Date   WBC 10.7 (H) 09/04/2018   HGB 15.3 (H) 09/04/2018   HCT 42.6 09/04/2018   MCV 91.6 09/04/2018   PLT 159 09/04/2018   CMP Latest Ref Rng & Units 01/29/2018  Glucose 70 - 99 mg/dL 81  BUN 6 - 20 mg/dL 9  Creatinine 4.090.44 - 8.111.00 mg/dL 9.140.71  Sodium 782135 - 956145 mmol/L 135  Potassium 3.5 - 5.1 mmol/L 3.5  Chloride 98 - 111 mmol/L 105  CO2 22 -  32 mmol/L 20(L)  Calcium 8.9 - 10.3 mg/dL 9.2  Total Protein 6.5 - 8.1 g/dL 7.0  Total Bilirubin 0.3 - 1.2 mg/dL 0.5  Alkaline Phos 38 - 126 U/L 39  AST 15 - 41 U/L 20  ALT 0 - 44 U/L 14    Discharge instructions: Per After Visit Summary and "Baby and Me Booklet"  After visit meds:  Allergies as of 09/06/2018   No Known Allergies     Medication List    STOP taking these medications   lidocaine 5 % Commonly known as: Lidoderm   methocarbamol 500 MG tablet Commonly known as: ROBAXIN   metoCLOPramide 10 MG tablet Commonly known as: REGLAN   metroNIDAZOLE 500 MG tablet Commonly known as: FLAGYL   ondansetron 4 MG tablet Commonly known as: ZOFRAN   ondansetron 8 MG disintegrating tablet Commonly known as: Zofran ODT     TAKE these medications   ibuprofen 600 MG tablet Commonly known as: ADVIL Take 1 tablet (600 mg total) by mouth every 6 (six) hours.   oxyCODONE-acetaminophen 5-325 MG tablet Commonly known as: PERCOCET/ROXICET Take 1 tablet by mouth every 6 (six) hours as needed (pain scale less than 5 give 1, pain scale >5 patient may have 2).  Postpartum contraception: Undecided. Discussed LARCS as most effective, answered questions about all methods of birth control prior to discharge.  Diet: Routine Diet Activity: Advance as tolerated. Pelvic rest for 6 weeks.   Follow-up Appt:No future appointments. Follow-up Visit:No follow-ups on file.  Newborn Data: Live born female  Birth Weight:   APGAR: 1, 9  Newborn Delivery   Birth date/time: 09/04/2018 16:29:00 Delivery type: Vaginal, Spontaneous      Baby Feeding: Bottle Disposition:home with mother

## 2018-09-04 NOTE — Discharge Instructions (Signed)
Braxton Hicks Contractions Contractions of the uterus can occur throughout pregnancy, but they are not always a sign that you are in labor. You may have practice contractions called Braxton Hicks contractions. These false labor contractions are sometimes confused with true labor. What are Braxton Hicks contractions? Braxton Hicks contractions are tightening movements that occur in the muscles of the uterus before labor. Unlike true labor contractions, these contractions do not result in opening (dilation) and thinning of the cervix. Toward the end of pregnancy (32-34 weeks), Braxton Hicks contractions can happen more often and may become stronger. These contractions are sometimes difficult to tell apart from true labor because they can be very uncomfortable. You should not feel embarrassed if you go to the hospital with false labor. Sometimes, the only way to tell if you are in true labor is for your health care provider to look for changes in the cervix. The health care provider will do a physical exam and may monitor your contractions. If you are not in true labor, the exam should show that your cervix is not dilating and your water has not broken. If there are no other health problems associated with your pregnancy, it is completely safe for you to be sent home with false labor. You may continue to have Braxton Hicks contractions until you go into true labor. How to tell the difference between true labor and false labor True labor  Contractions last 30-70 seconds.  Contractions become very regular.  Discomfort is usually felt in the top of the uterus, and it spreads to the lower abdomen and low back.  Contractions do not go away with walking.  Contractions usually become more intense and increase in frequency.  The cervix dilates and gets thinner. False labor  Contractions are usually shorter and not as strong as true labor contractions.  Contractions are usually irregular.  Contractions  are often felt in the front of the lower abdomen and in the groin.  Contractions may go away when you walk around or change positions while lying down.  Contractions get weaker and are shorter-lasting as time goes on.  The cervix usually does not dilate or become thin. Follow these instructions at home:   Take over-the-counter and prescription medicines only as told by your health care provider.  Keep up with your usual exercises and follow other instructions from your health care provider.  Eat and drink lightly if you think you are going into labor.  If Braxton Hicks contractions are making you uncomfortable: ? Change your position from lying down or resting to walking, or change from walking to resting. ? Sit and rest in a tub of warm water. ? Drink enough fluid to keep your urine pale yellow. Dehydration may cause these contractions. ? Do slow and deep breathing several times an hour.  Keep all follow-up prenatal visits as told by your health care provider. This is important. Contact a health care provider if:  You have a fever.  You have continuous pain in your abdomen. Get help right away if:  Your contractions become stronger, more regular, and closer together.  You have fluid leaking or gushing from your vagina.  You pass blood-tinged mucus (bloody show).  You have bleeding from your vagina.  You have low back pain that you never had before.  You feel your baby's head pushing down and causing pelvic pressure.  Your baby is not moving inside you as much as it used to. Summary  Contractions that occur before labor are   called Braxton Hicks contractions, false labor, or practice contractions.  Braxton Hicks contractions are usually shorter, weaker, farther apart, and less regular than true labor contractions. True labor contractions usually become progressively stronger and regular, and they become more frequent.  Manage discomfort from Braxton Hicks contractions  by changing position, resting in a warm bath, drinking plenty of water, or practicing deep breathing. This information is not intended to replace advice given to you by your health care provider. Make sure you discuss any questions you have with your health care provider. Document Released: 06/06/2016 Document Revised: 01/03/2017 Document Reviewed: 06/06/2016 Elsevier Patient Education  2020 [employer]  

## 2018-09-04 NOTE — Discharge Instructions (Signed)

## 2018-09-04 NOTE — Progress Notes (Signed)
   OB/GYN Faculty Practice: Labor Progress Note  Subjective: Patient doing well without complaints.  Objective: BP 104/71   Pulse 68   Temp (!) 97.5 F (36.4 C) (Oral)   Resp 16   LMP 12/17/2017   SpO2 99%  Gen: well appearing, NAD Dilation: 8 Effacement (%): 90 Station: 0 Presentation: Vertex Exam by:: Dr. Sylvester Harder FHT: baseline 130bpm, mod variability, +accels, no decels Toco: every 2-4 minutes  Assessment and Plan: 28 y.o. [id number] [redacted]w[redacted]d presents for labor  Labor: progressing well, AROM clear fluid @1100  -- pain control: CSE, well controlled -- PPH Risk: low  Fetal Well-Being: Cephalic by cervical exam.  -- Category 1 - continuous fetal monitoring  -- GBS negative  Drug use --prior marijuana use --UDS pending  Corliss Blacker, PGY-III Family Medicine 11:10 AM

## 2018-09-04 NOTE — Progress Notes (Signed)
   OB/GYN Faculty Practice: Labor Progress Note  Subjective: Patient doing well without complaints.  Objective: BP 120/71 (BP Location: Right Arm)   Pulse 65   Temp 98.3 F (36.8 C) (Oral)   Resp 20   LMP 12/17/2017   SpO2 99%  Gen: well appearing, NAD Dilation: Lip/rim Effacement (%): 90 Station: 0, Plus 1 Presentation: Vertex Exam by:: whitlock FHT: baseline 120bpm, mod variability, +accels, no decels Toco: q2 minutes  Assessment and Plan: 28 y.o. [id number] [redacted]w[redacted]d presents for labor  Labor: progressing well, s/p AROM clear fluid @1100 , pit @4  -- pain control: CSE, well controlled -- PPH Risk: low  Fetal Well-Being: Cephalic by cervical exam.  -- Category 1 - continuous fetal monitoring  -- GBS negative  Drug use --prior marijuana use --UDS +THC --Will consult SW postpartum  Corliss Blacker, PGY-III Family Medicine 3:00 PM

## 2018-09-04 NOTE — MAU Note (Signed)
I have communicated with Dr. Janus Molder and reviewed vital signs:  Vitals:   09/03/18 2200  BP: 118/75  Pulse: 81  Resp: 18  Temp: 98.6 F (37 C)    Vaginal exam:  Dilation: 4 Effacement (%): 70 Station: -2 Presentation: Vertex Exam by:: Esau Grew, RN,   Also reviewed contraction pattern and that non-stress test is reactive.  It has been documented that patient is contracting every 3-6 minutes with minimal cervical change over 4 hours not indicating active labor.  Patient denies any other complaints.  Based on this report provider has given order for discharge.  A discharge order and diagnosis entered by a provider.   Labor discharge instructions reviewed with patient.

## 2018-09-04 NOTE — H&P (Addendum)
LABOR AND DELIVERY ADMISSION HISTORY AND PHYSICAL NOTE  Sara Arroyo is a 28 y.o. female G3P1011 with IUP at 6542w4d by LMP presenting for SOL.  She reports positive fetal movement. She denies leakage of fluid or vaginal bleeding.  Prenatal History/Complications: [facility] Pregnancy complications:  - Nicotine and marijuana use  Past Medical History: Past Medical History:  Diagnosis Date  . Abnormal Pap smear   . Hypokalemia     Past Surgical History: Past Surgical History:  Procedure Laterality Date  . COLPOSCOPY  10/2011    Obstetrical History: OB History    Gravida  3   Para  1   Term  1   Preterm      AB  1   Living  1     SAB      TAB  1   Ectopic      Multiple      Live Births              Social History: Social History   Socioeconomic History  . Marital status: Single    Spouse name: Not on file  . Number of children: Not on file  . Years of education: Not on file  . Highest education level: Not on file  Occupational History  . Not on file  Social Needs  . Financial resource strain: Not on file  . Food insecurity    Worry: Not on file    Inability: Not on file  . Transportation needs    Medical: Not on file    Non-medical: Not on file  Tobacco Use  . Smoking status: Never Smoker  . Smokeless tobacco: Never Used  Substance and Sexual Activity  . Alcohol use: No  . Drug use: No  . Sexual activity: Yes  Lifestyle  . Physical activity    Days per week: Not on file    Minutes per session: Not on file  . Stress: Not on file  Relationships  . Social Musicianconnections    Talks on phone: Not on file    Gets together: Not on file    Attends religious service: Not on file    Active member of club or organization: Not on file    Attends meetings of clubs or organizations: Not on file    Relationship status: Not on file  Other Topics Concern  . Not on file  Social History Narrative  . Not on file    Family History: Family  History  Problem Relation Age of Onset  . Cancer Father     Allergies: No Known Allergies  Medications Prior to Admission  Medication Sig Dispense Refill Last Dose  . lidocaine (LIDODERM) 5 % Place 1 patch onto the skin daily. Remove & Discard patch within 12 hours or as directed by MD (Patient not taking: Reported on 01/29/2018) 30 patch 0   . methocarbamol (ROBAXIN) 500 MG tablet Take 1 tablet (500 mg total) by mouth 2 (two) times daily. (Patient not taking: Reported on 01/29/2018) 20 tablet 0   . metoCLOPramide (REGLAN) 10 MG tablet Take 1 tablet (10 mg total) by mouth every 6 (six) hours. 20 tablet 0   . metroNIDAZOLE (FLAGYL) 500 MG tablet Take 1 tablet (500 mg total) by mouth 2 (two) times daily. 14 tablet 0   . ondansetron (ZOFRAN ODT) 8 MG disintegrating tablet Take 1 tablet (8 mg total) by mouth every 8 (eight) hours as needed for nausea or vomiting. 20 tablet 0   .  ondansetron (ZOFRAN) 4 MG tablet Take 1 tablet (4 mg total) by mouth every 8 (eight) hours as needed for nausea or vomiting. (Patient not taking: Reported on 09/10/2017) 15 tablet 0      Review of Systems  All systems reviewed and negative except as stated in HPI  Physical Exam Blood pressure 94/74, pulse 70, temperature 97.9 F (36.6 C), temperature source Oral, resp. rate 20, last menstrual period 12/17/2017. General appearance: alert, oriented Lungs: normal respiratory effort Heart: regular rate Abdomen: soft, non-tender; gravid, FH appropriate for GA Extremities: No calf swelling or tenderness Presentation: cephalic Fetal monitoring: 125 bpm moderate variability 15 x15 acels early decels Uterine activity: 2-3 mins Dilation: 6.5 Effacement (%): 90 Station: -1 Exam by:: Blima Singer, RNC  Prenatal labs: ABO, Rh: --/--/PENDING (07/31 0542) Antibody: PENDING (07/31 0542) Rubella:  Immune (03/11/18) RPR:  Negative  HBsAg:  Negative  HIV:  Non reactive  GC/Chlamydia: Negative 08/13/18 GBS:  Negative  (08/13/18) 2-hr GTT: WNL Genetic screening: negative Anatomy US: RVOT not well visualized but otherwise WNL.  Prenatal Transfer Tool  Maternal Diabetes: No Genetic Screening: Normal Maternal Ultrasounds/Referrals: Normal Fetal Ultrasounds or other Referrals:  None Maternal Substance Abuse:  Yes:  Type: Smoker, Marijuana Significant Maternal Medications:  None Significant Maternal Lab Results: Group B Strep negative  Results for orders placed or performed during the hospital encounter of 09/04/18 (from the past 24 hour(s))  CBC   Collection Time: 09/04/18  5:37 AM  Result Value Ref Range   WBC 10.7 (H) 4.0 - 10.5 K/uL   RBC 4.65 3.87 - 5.11 MIL/uL   Hemoglobin 15.3 (H) 12.0 - 15.0 g/dL   HCT 42.6 36.0 - 46.0 %   MCV 91.6 80.0 - 100.0 fL   MCH 32.9 26.0 - 34.0 pg   MCHC 35.9 30.0 - 36.0 g/dL   RDW 12.3 11.5 - 15.5 %   Platelets 159 150 - 400 K/uL   nRBC 0.0 0.0 - 0.2 %  Type and screen Peter   Collection Time: 09/04/18  5:42 AM  Result Value Ref Range   ABO/RH(D) PENDING    Antibody Screen PENDING    Sample Expiration      09/07/2018,2359 Performed at [facility], [place]. [address] [place], [place] [address]     Patient Active Problem List   Diagnosis Date Noted  . Labor without complication 01/60/1093  . Concern about STD in female without diagnosis 11/04/2014  . Encounter for Depo-Provera contraception 09/03/2012  . DUB (dysfunctional uterine bleeding) 09/03/2012  . Weight loss, non-intentional 08/30/2011    Assessment: Sara Arroyo is a 28 y.o. G3P1011 at [redacted]w[redacted]d here for SOL.  #Labor: Expectant management. #Pain: Epidural desired at this time. #FWB: Cat I #ID: GBS negative #MOF: Bottle #MOC: Unsure #Circ:  Unsure  Gerlene Fee, DO Family Medicine Resident, PGY-1 09/04/2018, 6:34 AM  I confirm that I have verified the information documented in the resident's note and that I have also personally reperformed the  physical exam and all medical decision making activities. The patient was seen and examined by me also Agree with note NST reactive and reassuring UCs as listed Cervical exams as listed in note  Seabron Spates, CNM

## 2018-09-04 NOTE — MAU Note (Signed)
Patient presents w/ contractions every 3 minutes.  No LOF/VB.  + FM.

## 2018-09-04 NOTE — Anesthesia Procedure Notes (Signed)
Epidural Patient location during procedure: OB Start time: 09/04/2018 7:27 AM End time: 09/04/2018 7:31 AM  Staffing Anesthesiologist: Brennan Bailey, MD Performed: anesthesiologist   Preanesthetic Checklist Completed: patient identified, pre-op evaluation, timeout performed, IV checked, risks and benefits discussed and monitors and equipment checked  Epidural Patient position: sitting Prep: site prepped and draped and DuraPrep Patient monitoring: continuous pulse ox, blood pressure and heart rate Approach: midline Location: L3-L4 Injection technique: LOR air  Needle:  Needle type: Tuohy  Needle gauge: 17 G Needle length: 9 cm Needle insertion depth: 5 cm Catheter type: closed end flexible Catheter size: 19 Gauge Catheter at skin depth: 10 cm Test dose: negative and Other (1% lidocaine)  Assessment Events: blood not aspirated, injection not painful, no injection resistance, negative IV test and no paresthesia  Additional Notes Patient identified. Risks, benefits, and alternatives discussed with patient including but not limited to bleeding, infection, nerve damage, paralysis, failed block, incomplete pain control, headache, blood pressure changes, nausea, vomiting, reactions to medication, itching, and postpartum back pain. Confirmed with bedside nurse the patient's most recent platelet count. Confirmed with patient that they are not currently taking any anticoagulation, have any bleeding history, or any family history of bleeding disorders. Patient expressed understanding and wished to proceed. All questions were answered. Sterile technique was used throughout the entire procedure. Please see nursing notes for vital signs.   Crisp LOR after one needle redirection. Test dose was given through epidural catheter and negative prior to continuing to dose epidural or start infusion. Warning signs of high block given to the patient including shortness of breath, tingling/numbness in  hands, complete motor block, or any concerning symptoms with instructions to call for help. Patient was given instructions on fall risk and not to get out of bed. All questions and concerns addressed with instructions to call with any issues or inadequate analgesia.  Reason for block:procedure for pain

## 2018-09-04 NOTE — Progress Notes (Signed)
Patient ID: Sara Arroyo, female   DOB: [birth year], 28 y.o.   MRN: [id number]   SVD 09/04/18 at 1629. Called to room following SVD to relieve Dr. Gala Romney and manage delivery of placenta. Placenta delivered spontaneously with gentle cord traction at 1648. Appears intact. Fundus firm with massage and Pitocin. Labia, perineum, vagina, and cervix inspected with bilateral labial lacerations.   Lacerations: bilateral labial, shallow and hemostatic, repair not indicated  QBL via Triton (mL): 227  See separate delivery note by Dr. Gala Romney.  Sara Arroyo, CNM 09/04/18 5:56 PM

## 2018-09-05 MED ORDER — OXYCODONE-ACETAMINOPHEN 5-325 MG PO TABS
1.0000 | ORAL_TABLET | Freq: Four times a day (QID) | ORAL | Status: DC | PRN
  Administered 2018-09-05: 1 via ORAL
  Administered 2018-09-06: 2 via ORAL
  Filled 2018-09-05: qty 2
  Filled 2018-09-05: qty 1

## 2018-09-05 NOTE — Progress Notes (Signed)
   09/04/18 2042  What Happened  Was fall witnessed? No  Was patient injured? No  Patient found on floor  Found by Staff-comment  Stated prior activity bathroom-unassisted  Follow Up  MD notified Dr. Janus Molder  Time MD notified 2107  Family notified Yes - comment (significant other at bedside)  Time family notified 2100 (notified by patient)  Additional tests No  Simple treatment Other (comment) (pt refused)  Adult Fall Risk Assessment  Risk Factor Category (scoring not indicated) Fall has occurred during this admission (document High fall risk)  Age 28  Fall History: Fall within 6 months prior to admission 0  Elimination; Bowel and/or Urine Incontinence 0  Elimination; Bowel and/or Urine Urgency/Frequency 0  Medications: includes PCA/Opiates, Anti-convulsants, Anti-hypertensives, Diuretics, Hypnotics, Laxatives, Sedatives, and Psychotropics 3  Patient Care Equipment 0  Mobility-Assistance 2  Mobility-Gait 2  Mobility-Sensory Deficit 0  Altered awareness of immediate physical environment 0  Impulsiveness 0  Lack of understanding of one's physical/cognitive limitations 0  Total Score 7  Patient Fall Risk Level High fall risk  Adult Fall Risk Interventions  Required Bundle Interventions *See Row Information* High fall risk - low, moderate, and high requirements implemented  Additional Interventions Use of appropriate toileting equipment (bedpan, BSC, etc.);Family Supervision  Vitals  Temp 98.4 F (36.9 C)  Temp Source Oral  BP 114/70  BP Location Right Arm  BP Method Automatic  Patient Position (if appropriate) Sitting  Pulse Rate 69  Pulse Rate Source Dinamap  Resp 20  Oxygen Therapy  SpO2 99 %  Pain Assessment  Pain Scale 0-10  Pain Score 0  Neurological  Neuro (WDL) WDL  Musculoskeletal  Musculoskeletal (WDL) WDL  Integumentary  Integumentary (WDL) WDL  Pain Assessment  Date Pain First Started  (pt denies pain)  Pain Screening  Clinical Progression  (pt  denies pain)    RN informed pt at beginning of shift to call for assistance before getting out of bed due to leg numbness. Pt called out for assistance to the bathroom, RN entered the room at 20:24 with the stedy and found the patient kneeled down on her left knee on the floor. Pt stated she felt the urge to void, stood up, and lowered herself to the floor when her left leg "gave out".  RN helped patient stedy to the bathroom and back to bed. Assessment and vital signs WDL, pt denies pain. MD made aware at 2107, no new orders.   Sara Bienenstock, RN 09/05/2018 7:38 AM

## 2018-09-05 NOTE — Progress Notes (Signed)
Patient called for pain medicine at 2110. When this nurse went in to give tylenol (ordered), she began cursing and yelling that she hadn't eaten in two days, that she was allergic to the food we had brought her, and why was I bringing her tylenol and that she wanted to speak to the director. She took the pills and placed them in a cup.  I explained I couldn't leave until she took the medicine I gave her.  I explained that the motrin she had taken at 1800 was not due again until midnight, and that the tylenol was what was ordered. She cursed again, said the tylenol wouldn't work, that she wouldn't take anything until she spoke to Clinical biochemist. And then she told me that she wanted another nurse and to leave the room.  This nurse lleft and reported to Armen Pickup, charge nurse, who followed up by notifying house coverage.
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
## 2018-09-05 NOTE — Progress Notes (Signed)
Patient pulls emergency light in her bathroom, upon entering room patient begins cussing at staff and becomes very agitated. Patient states she only needed pads. Patient then demands that IV be removed immediately or she would remove it herself. I explained to patient that we prefer to leave IV access for 24 hours, patient starts to remove IV herself. IV removed by RN per demand of patient. Will continue to monitor.

## 2018-09-05 NOTE — Progress Notes (Signed)
POSTPARTUM PROGRESS NOTE  Post Partum Day 1  Subjective:  Sara Arroyo is a 28 y.o. [id number] s/p SVD at [redacted]w[redacted]d.  She reports she is doing well. She has an acute fall at the beginning of the night but denies any residual injury and she reports is more as a stumble. She denies any problems with ambulating, voiding or po intake. Denies nausea or vomiting.  Pain is well controlled.  Lochia is appropriate.  Objective: Blood pressure 107/63, pulse 67, temperature 98 F (36.7 C), resp. rate 16, last menstrual period 12/17/2017, SpO2 99 %, unknown if currently breastfeeding.  Physical Exam:  General: alert, cooperative and no distress Chest: no respiratory distress Heart: distal pulses intact Abdomen: soft, nontender Uterine Fundus: firm, appropriately tender DVT Evaluation: No calf swelling or tenderness Extremities: No edema Skin: warm, dry  Recent Labs    09/04/18 0537  HGB 15.3*  HCT 42.6    Assessment/Plan: Sara Arroyo is a 28 y.o. [id number] s/p SVD at [redacted]w[redacted]d   PPD#1 - Doing well  Routine postpartum care Contraception: unsure Feeding: bottle Dispo: Plan for discharge home 09/06/2018.   LOS: 1 day   [employer], D.O. Family Medicine Resident, PGY-1 09/05/2018, 6:46 AM

## 2018-09-05 NOTE — Progress Notes (Signed)
  POSTPARTUM PROGRESS NOTE  Post Partum Day 1  Subjective:  Sara Arroyo is a 28 y.o. [id number] s/p SVD at [redacted]w[redacted]d.  I went to see Sara Arroyo this evening and she was up and ambulating around the room and did not initially want to speak with me. She states that she is having moderate constant non specific pain and wants something that will help. At this time she is angry and upset that she has not been able to sleep during her postpartum stay. She needs the pain to subside to relax. She states that when she is in pain everyone will suffer. She has been non compliant with the nursing staff, cursing, and threatening. She has agreed to try another pain medicine to help her sleep.    Objective: Blood pressure 117/66, pulse (!) 58, temperature 98.7 F (37.1 C), temperature source Oral, resp. rate 17, last menstrual period 12/17/2017, SpO2 99 %, unknown if currently breastfeeding.  Physical Exam:  General: alert, cooperative and no distress Chest: no respiratory distress Abdomen: deferred due to combative nature Uterine Fundus: deferred due to combative nature DVT Evaluation: No calf swelling or tenderness Extremities: No edema   Recent Labs    09/04/18 0537  HGB 15.3*  HCT 42.6    Assessment/Plan: Sara Arroyo is a 28 y.o. [id number] s/p SVD at [redacted]w[redacted]d.  Percocet 1-2 tablet (Pain scale < 5 give 1, >5 patient may have 2)   Aysen Shieh Autry-Lott, D.O. Family Medicine Resident, PGY-1 09/05/2018, 10:25 PM

## 2018-09-05 NOTE — Anesthesia Postprocedure Evaluation (Signed)
Anesthesia Post Note  Patient: Sara Arroyo  Procedure(s) Performed: AN AD HOC LABOR EPIDURAL     Patient location during evaluation: Mother Baby Anesthesia Type: Epidural Level of consciousness: awake and alert and oriented Pain management: satisfactory to patient Vital Signs Assessment: post-procedure vital signs reviewed and stable Respiratory status: spontaneous breathing and nonlabored ventilation Cardiovascular status: stable Postop Assessment: no headache, no backache, no signs of nausea or vomiting, adequate PO intake, patient able to bend at knees and able to ambulate (patient up walking) Anesthetic complications: no    Last Vitals:  Vitals:   09/05/18 0530 09/05/18 0924  BP: 107/63 108/68  Pulse: 67 62  Resp: 16 16  Temp:  36.7 C  SpO2: 99%     Last Pain:  Vitals:   09/05/18 0924  TempSrc: Oral  PainSc: 0-No pain   Pain Goal: Patients Stated Pain Goal: 10 (09/04/18 0718)                 Willa Rough

## 2018-09-06 MED ORDER — IBUPROFEN 600 MG PO TABS: 600.0000 mg | ORAL_TABLET | Freq: Four times a day (QID) | ORAL | 0 refills | Status: DC

## 2018-09-06 MED ORDER — OXYCODONE-ACETAMINOPHEN 5-325 MG PO TABS: 1.0000 | ORAL_TABLET | Freq: Four times a day (QID) | ORAL | 0 refills | Status: DC | PRN

## 2018-09-06 NOTE — Progress Notes (Signed)
Notified by patients nurse that patient was upset, cursing and did not want the nurse to come back in room. Pt called out to the nursing station cursing, threatening staff and demanding to speak to the house supervisor. AC and Security both notified and both came to the unit. AC and charge nurse went to see patient. Pt expressed that she was in pain and wanted motrin 600. Charge nurse explained to her that her motrin was given to her last around 1733 and she could not have anymore until atleast 6 hours. Charge nurse offered her the tylenol her nurse originally offered to her or to call her Doctor to see if she could order something additional. She declined the tylenol and requested the MD to be called. MD notified and came to visit patient.

## 2018-09-06 NOTE — Progress Notes (Addendum)
CSW made [facility] CPS report for infant's positive UDS for THC.   CSW confirmed with CPS that there are no barriers to discharge at this time. They will follow up with [name] once arrived home.     Virgie Dad Jeris Roser, MSW, LCSW [facility] [place] [facility] [place] [phone]

## 2018-09-11 ENCOUNTER — Encounter: Payer: Self-pay | Admitting: Obstetrics & Gynecology

## 2018-09-11 DIAGNOSIS — Z8759 Personal history of other complications of pregnancy, childbirth and the puerperium: Secondary | ICD-10-CM | POA: Insufficient documentation

## 2018-11-02 ENCOUNTER — Other Ambulatory Visit: Payer: Self-pay

## 2018-11-02 ENCOUNTER — Emergency Department (HOSPITAL_COMMUNITY)
Admission: EM | Admit: 2018-11-02 | Discharge: 2018-11-02 | Disposition: A | Discharge status: Home / Self Care | Payer: Medicaid Other | Attending: Emergency Medicine | Admitting: Emergency Medicine

## 2018-11-02 ENCOUNTER — Encounter (HOSPITAL_COMMUNITY): Payer: Self-pay

## 2018-11-02 DIAGNOSIS — L739 Follicular disorder, unspecified: Secondary | ICD-10-CM | POA: Diagnosis not present

## 2018-11-02 DIAGNOSIS — R21 Rash and other nonspecific skin eruption: Secondary | ICD-10-CM

## 2018-11-02 MED ORDER — DOXYCYCLINE HYCLATE 100 MG PO CAPS: 100.0000 mg | ORAL_CAPSULE | Freq: Two times a day (BID) | ORAL | 0 refills | Status: AC

## 2018-11-02 NOTE — ED Triage Notes (Signed)
Patient here with itchy rash x 5 days. Fine rash with no known exposure

## 2018-11-02 NOTE — Discharge Instructions (Addendum)
Take antibiotics as prescribed.  Take the entire course, even if your symptoms improve. Use an antihistamine such as Benadryl or Zyrtec or Claritin to help with rash and itching. Follow-up with your OB/GYN for further evaluation of your postpartum symptoms. Follow-up with a dermatologist if your rash is not improving. Return to the emergency room with any new, worsening, concerning symptoms.

## 2018-11-02 NOTE — ED Provider Notes (Signed)
[facility]ENC[facility] Provider Note   CSN: [id number] Arrival date & time: 11/02/18  [id number]     History   Chief Complaint Chief Complaint  Patient presents with  . Rash    HPI Sara Arroyo is a 28 y.o. female presenting for evaluation of rash.  Patient states she developed a fine rash of her upper chest and bilateral upper extremities over the past week.  She reports is both pruritic and painful.  She has not taken anything for this including Benadryl.  Patient states since giving birth 8 weeks ago, she has had lesions appear on her torso and near her groin.  They become white spots that then pop, drain, and then look like pimples.  Patient states she has not had this evaluated, as she is called to make it postpartum appointment, but had no response.  Patient states she was having vaginal bleeding until last week, but it has resolved.  She denies fevers, chills, cough, chest pain, shortness breath, nausea, vomiting, abdominal pain.  Patient states she has no medical problems, takes no medications daily.  She is not breast-feeding.  No one else at home has similar rash.  Patient did get a perm 2 weeks ago, but no other new exposures.     HPI  Past Medical History:  Diagnosis Date  . Abnormal Pap smear   . Hypokalemia     Patient Active Problem List   Diagnosis Date Noted  . History of shoulder dystocia in prior pregnancy 09/11/2018  . Labor without complication 09/04/2018  . Concern about STD in female without diagnosis 11/04/2014  . Encounter for Depo-Provera contraception 09/03/2012  . DUB (dysfunctional uterine bleeding) 09/03/2012  . Weight loss, non-intentional 08/30/2011    Past Surgical History:  Procedure Laterality Date  . COLPOSCOPY  10/2011     OB History    Gravida  3   Para  2   Term  2   Preterm      AB  1   Living  2     SAB      TAB  1   Ectopic      Multiple  0   Live Births  1            Home  Medications    Prior to Admission medications   Medication Sig Start Date End Date Taking? Authorizing Provider  doxycycline (VIBRAMYCIN) 100 MG capsule Take 1 capsule (100 mg total) by mouth 2 (two) times daily for 7 days. 11/02/18 11/09/18  Purva Vessell, PA-C  ibuprofen (ADVIL) 600 MG tablet Take 1 tablet (600 mg total) by mouth every 6 (six) hours. 09/06/18   Leftwich-Kirby, Wilmer FloorLisa A, CNM  oxyCODONE-acetaminophen (PERCOCET/ROXICET) 5-325 MG tablet Take 1 tablet by mouth every 6 (six) hours as needed (pain scale less than 5 give 1, pain scale >5 patient may have 2). 09/06/18   Leftwich-Kirby, Wilmer FloorLisa A, CNM    Family History Family History  Problem Relation Age of Onset  . Cancer Father     Social History Social History   Tobacco Use  . Smoking status: Never Smoker  . Smokeless tobacco: Never Used  Substance Use Topics  . Alcohol use: No  . Drug use: No     Allergies   Patient has no known allergies.   Review of Systems Review of Systems  Skin: Positive for rash.  All other systems reviewed and are negative.    Physical Exam Updated Vital Signs BP Marland Kitchen(!)  104/59   Pulse 89   Temp 98.7 F (37.1 C) (Oral)   Resp 18   LMP 12/17/2017   SpO2 99%   Physical Exam Vitals signs and nursing note reviewed.  Constitutional:      General: She is not in acute distress.    Appearance: She is well-developed.     Comments: Sitting comfortably in the chair no acute distress  HENT:     Head: Normocephalic and atraumatic.  Eyes:     Conjunctiva/sclera: Conjunctivae normal.     Pupils: Pupils are equal, round, and reactive to light.  Neck:     Musculoskeletal: Normal range of motion and neck supple.  Cardiovascular:     Rate and Rhythm: Normal rate and regular rhythm.     Pulses: Normal pulses.  Pulmonary:     Effort: Pulmonary effort is normal. No respiratory distress.     Breath sounds: Normal breath sounds. No wheezing.  Abdominal:     General: There is no distension.      Palpations: Abdomen is soft. There is no mass.     Tenderness: There is no abdominal tenderness. There is no guarding or rebound.  Musculoskeletal: Normal range of motion.  Skin:    General: Skin is warm and dry.     Capillary Refill: Capillary refill takes less than 2 seconds.     Findings: Rash present.     Comments: Fine, papular rash, mostly noted on the anterior chest, though patient also reporting on bilateral upper arms.  No obvious erythema. Discrete lesions on back and abdomen in various stages of healing consistent with infected folliculitis lesions.  No active drainage.  No surrounding skin erythema or signs of cellulitis.  Neurological:     Mental Status: She is alert and oriented to person, place, and time.      ED Treatments / Results  Labs (all labs ordered are listed, but only abnormal results are displayed) Labs Reviewed - No data to display  EKG None  Radiology No results found.  Procedures Procedures (including critical care time)  Medications Ordered in ED Medications - No data to display   Initial Impression / Assessment and Plan / ED Course  I have reviewed the triage vital signs and the nursing notes.  Pertinent labs & imaging results that were available during my care of the patient were reviewed by me and considered in my medical decision making (see chart for details).        Patient presenting for evaluation of rash.  Physical exam shows patient appears nontoxic.  Patient appears to have 2 separate rashes that have been going on for separate amount of times.  Patient's papular rash on her upper chest may be due to recent perm.  But also consider noninfected folliculitis.  Patient with discrete lesions on the torso and very stages of healing, consistent with infected folliculitis lesions.  As such, will treat with antibiotics.  Discussed use of antihistamines for itching.  Encourage follow-up with OB/GYN for postpartum visit.  Encourage follow-up  with dermatology symptoms not proving.  At this time, patient appears safe for discharge.  Return precautions given.  Patient states she understands and agrees to plan.  Final Clinical Impressions(s) / ED Diagnoses   Final diagnoses:  Rash and nonspecific skin eruption  Folliculitis    ED Discharge Orders         Ordered    doxycycline (VIBRAMYCIN) 100 MG capsule  2 times daily     11/02/18 1009  Franchot Heidelberg, PA-C 11/02/18 1054    Tegeler, Gwenyth Allegra, MD 11/02/18 1739

## 2019-03-05 ENCOUNTER — Telehealth: Payer: Self-pay | Admitting: Genetic Counselor

## 2019-03-05 NOTE — Telephone Encounter (Signed)
Called patient regarding upcoming virtual visit, left a voicemail. Walk-in visit unless patient calls back.

## 2019-03-09 ENCOUNTER — Other Ambulatory Visit: Payer: Medicaid Other

## 2019-03-09 ENCOUNTER — Inpatient Hospital Stay: Payer: Medicaid Other | Attending: Genetic Counselor | Admitting: Genetic Counselor

## 2019-07-30 DIAGNOSIS — Z20822 Contact with and (suspected) exposure to covid-19: Secondary | ICD-10-CM | POA: Diagnosis not present

## 2019-09-23 IMAGING — US US OB < 14 WEEKS - US OB TV
1 series · 14 of 28 positions shown · non-contrast
Comparison: None.

CLINICAL DATA: Pelvic pain in the first trimester pregnancy.

EXAM:
OBSTETRIC <14 WK US AND TRANSVAGINAL OB US
TECHNIQUE: Both transabdominal and transvaginal ultrasound examinations were
performed for complete evaluation of the gestation as well as the
maternal uterus, adnexal regions, and pelvic cul-de-sac.
Transvaginal technique was performed to assess early pregnancy.

[Series 1: us ob < 14 weeks - us ob tv · 35 acquisitions, 14 frames shown]
[im 2/35]
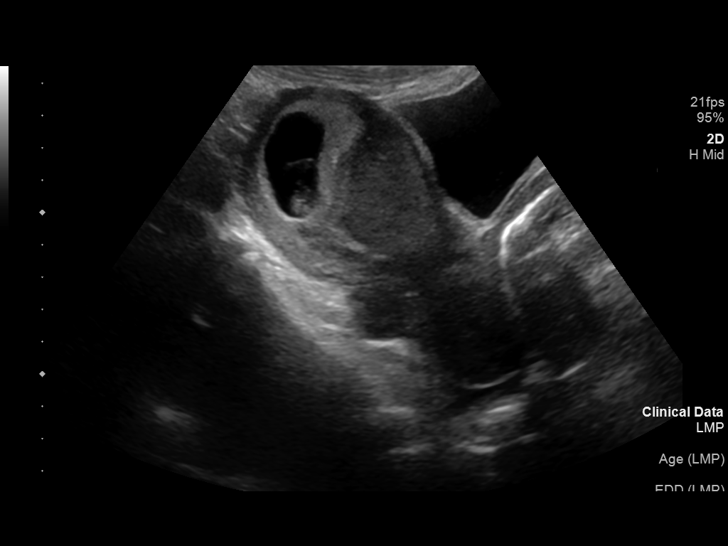
[im 4/35]
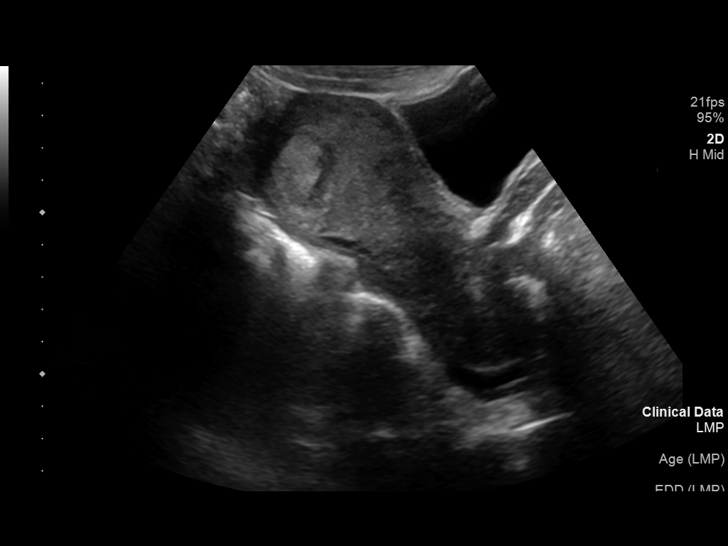
[im 7/35]
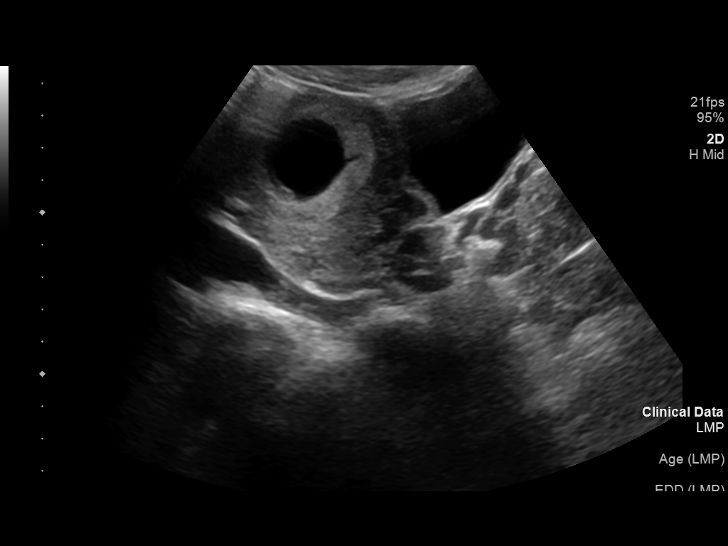
[im 9/35]
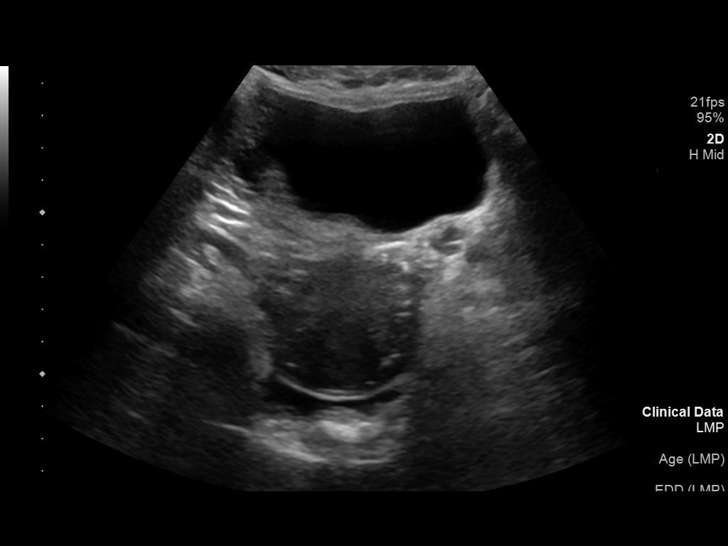
[im 12/35]
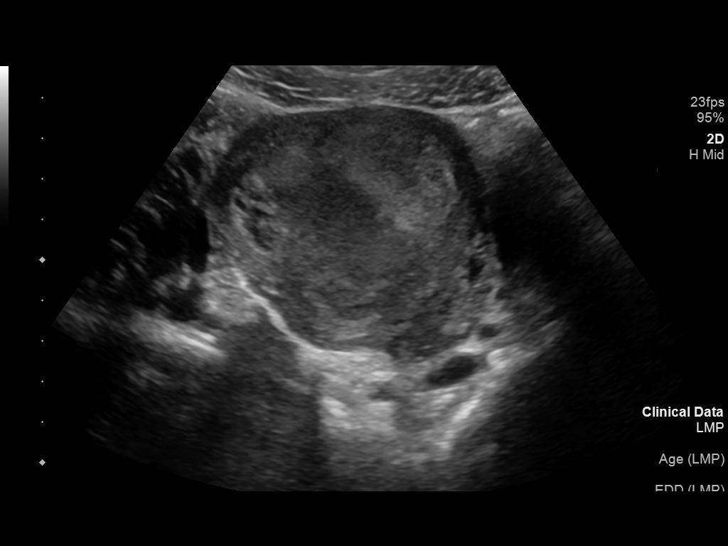
[im 14/35]
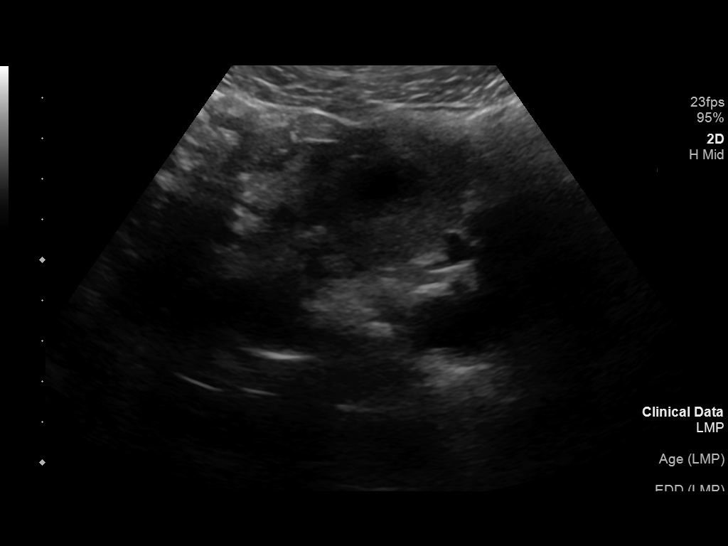
[im 17/35]
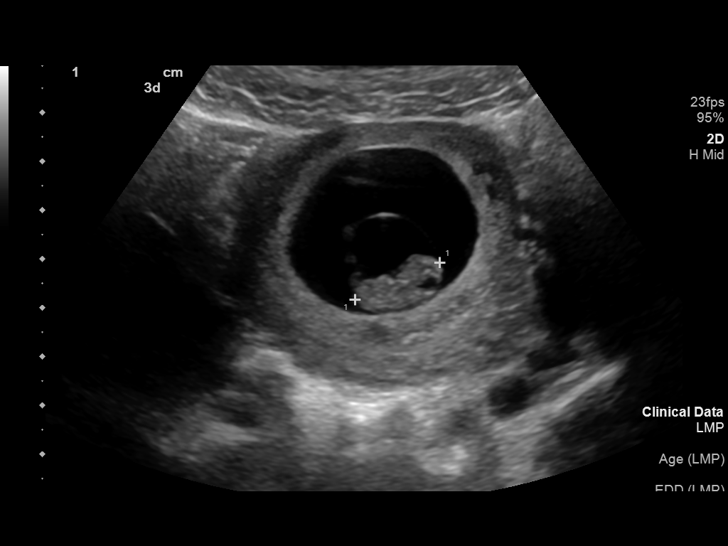
[im 19/35]
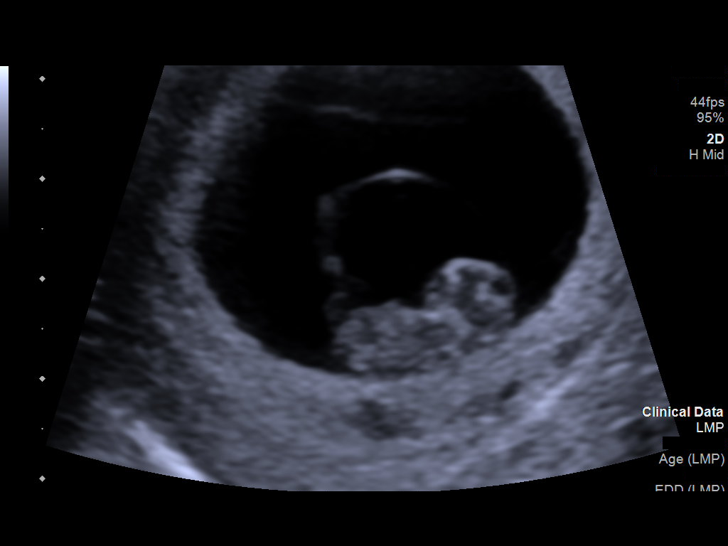
[im 22/35]
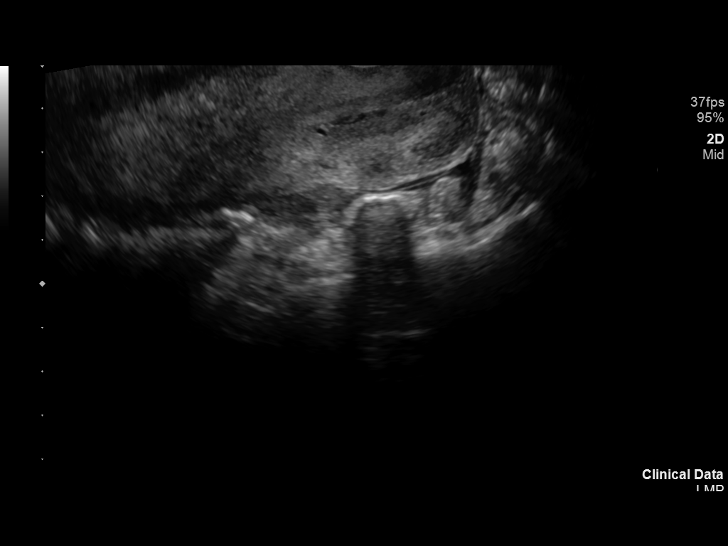
[im 24/35]
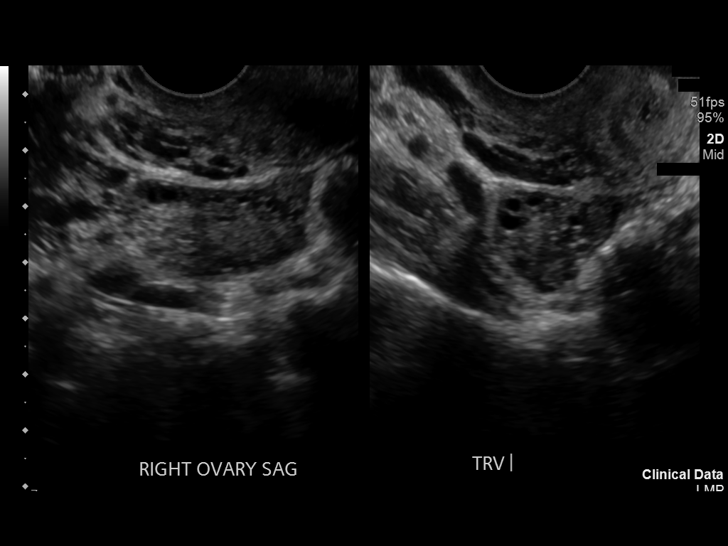
[im 27/35]
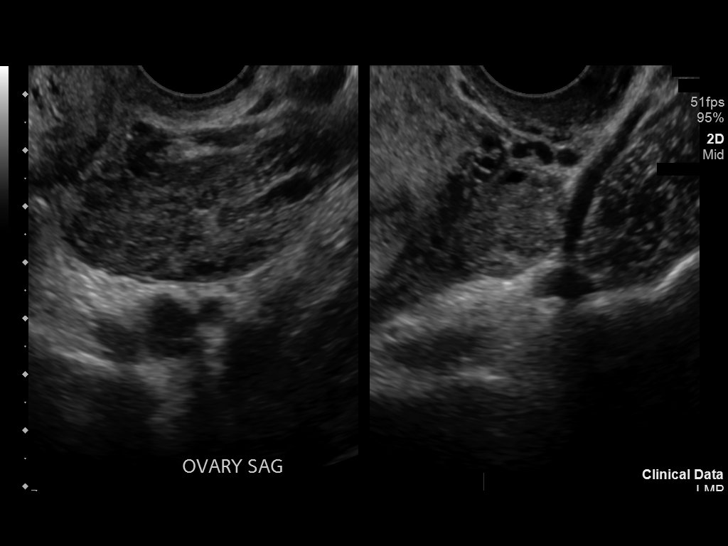
[im 29/35]
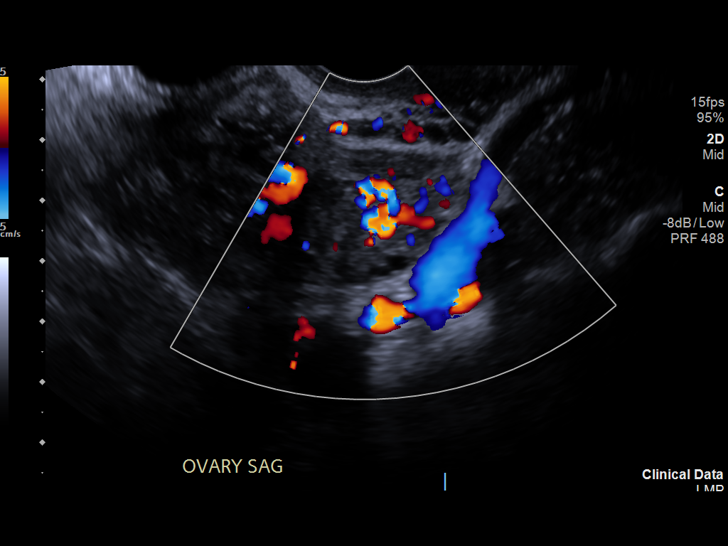
[im 32/35]
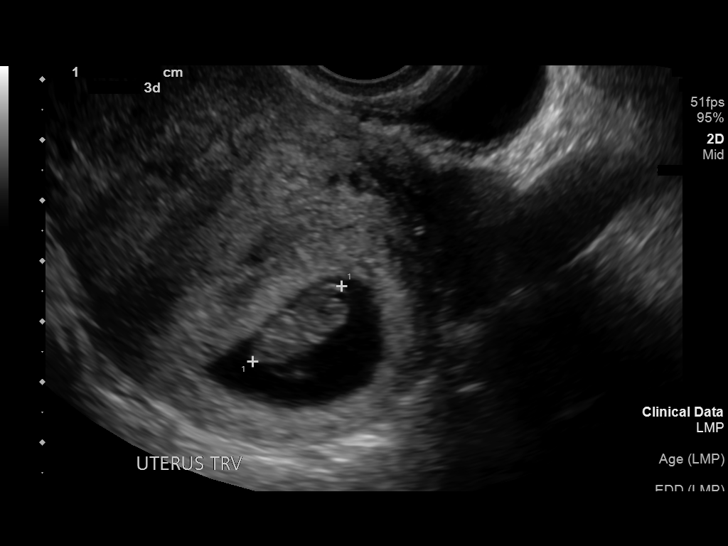
[im 35/35]
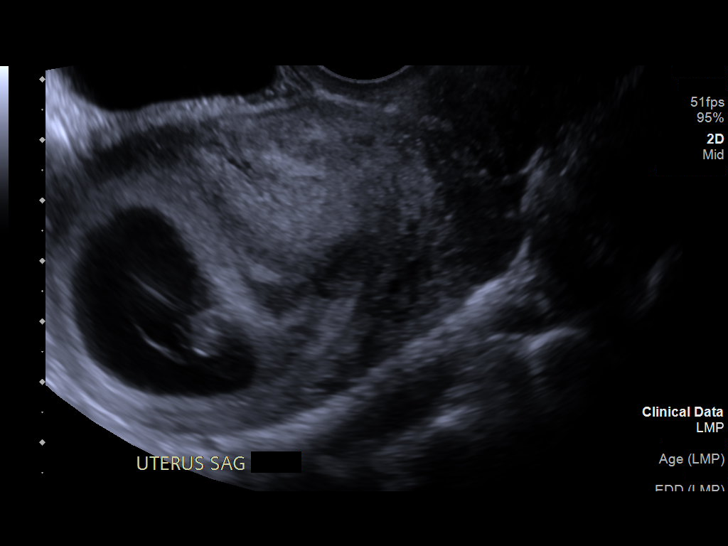

[14 of 28 positions shown; findings below may reference images not displayed]

FINDINGS: Intrauterine gestational sac: Single

Yolk sac:  Visualized.

Embryo:  Visualized.

Cardiac Activity: Visualized.

Heart Rate: 170 bpm

CRL:  19 mm   8 w   3 d                  US EDC: 09/07/2018

Subchorionic hemorrhage:  None visualized.

Maternal uterus/adnexae: Normal with trace free fluid in pelvis
likely physiologic.
IMPRESSION: Single live intrauterine 8 week 3 day gestation without complicating
features.

## 2019-10-19 DIAGNOSIS — Z3481 Encounter for supervision of other normal pregnancy, first trimester: Secondary | ICD-10-CM | POA: Diagnosis not present

## 2019-10-19 DIAGNOSIS — O26849 Uterine size-date discrepancy, unspecified trimester: Secondary | ICD-10-CM | POA: Diagnosis not present

## 2019-10-19 DIAGNOSIS — Z3480 Encounter for supervision of other normal pregnancy, unspecified trimester: Secondary | ICD-10-CM | POA: Diagnosis not present

## 2019-10-19 LAB — OB RESULTS CONSOLE GC/CHLAMYDIA
Chlamydia: NEGATIVE
Gonorrhea: NEGATIVE

## 2019-10-19 LAB — OB RESULTS CONSOLE HIV ANTIBODY (ROUTINE TESTING): HIV: NONREACTIVE

## 2019-10-19 LAB — HEPATITIS C ANTIBODY: HCV Ab: NEGATIVE

## 2019-10-19 LAB — OB RESULTS CONSOLE RUBELLA ANTIBODY, IGM: Rubella: IMMUNE

## 2019-10-19 LAB — OB RESULTS CONSOLE HEPATITIS B SURFACE ANTIGEN: Hepatitis B Surface Ag: NEGATIVE

## 2019-10-20 DIAGNOSIS — Z124 Encounter for screening for malignant neoplasm of cervix: Secondary | ICD-10-CM | POA: Diagnosis not present

## 2019-10-20 DIAGNOSIS — R87810 Cervical high risk human papillomavirus (HPV) DNA test positive: Secondary | ICD-10-CM | POA: Diagnosis not present

## 2019-10-20 DIAGNOSIS — Z113 Encounter for screening for infections with a predominantly sexual mode of transmission: Secondary | ICD-10-CM | POA: Diagnosis not present

## 2019-12-21 DIAGNOSIS — Z363 Encounter for antenatal screening for malformations: Secondary | ICD-10-CM | POA: Diagnosis not present

## 2020-01-03 ENCOUNTER — Other Ambulatory Visit: Payer: Self-pay

## 2020-01-03 ENCOUNTER — Encounter (HOSPITAL_COMMUNITY): Payer: Self-pay | Admitting: Obstetrics & Gynecology

## 2020-01-03 ENCOUNTER — Inpatient Hospital Stay (HOSPITAL_COMMUNITY)
Admission: AD | Admit: 2020-01-03 | Discharge: 2020-01-03 | Disposition: A | Discharge status: Home / Self Care | Payer: Medicaid Other | Attending: Obstetrics and Gynecology | Admitting: Obstetrics and Gynecology

## 2020-01-03 DIAGNOSIS — M549 Dorsalgia, unspecified: Secondary | ICD-10-CM | POA: Insufficient documentation

## 2020-01-03 DIAGNOSIS — O26892 Other specified pregnancy related conditions, second trimester: Secondary | ICD-10-CM | POA: Diagnosis not present

## 2020-01-03 DIAGNOSIS — Z3A23 23 weeks gestation of pregnancy: Secondary | ICD-10-CM | POA: Diagnosis not present

## 2020-01-03 DIAGNOSIS — O99891 Other specified diseases and conditions complicating pregnancy: Secondary | ICD-10-CM

## 2020-01-03 LAB — COMPREHENSIVE METABOLIC PANEL
ALT: 8 U/L (ref 0–44)
AST: 16 U/L (ref 15–41)
Albumin: 2.9 g/dL — ABNORMAL LOW (ref 3.5–5.0)
Alkaline Phosphatase: 52 U/L (ref 38–126)
Anion gap: 6 (ref 5–15)
BUN: 5 mg/dL — ABNORMAL LOW (ref 6–20)
CO2: 25 mmol/L (ref 22–32)
Calcium: 8.4 mg/dL — ABNORMAL LOW (ref 8.9–10.3)
Chloride: 105 mmol/L (ref 98–111)
Creatinine, Ser: 0.63 mg/dL (ref 0.44–1.00)
GFR, Estimated: >60 mL/min (ref >60)
Glucose, Bld: 76 mg/dL (ref 70–99)
Potassium: 3.7 mmol/L (ref 3.5–5.1)
Sodium: 136 mmol/L (ref 135–145)
Total Bilirubin: 0.2 mg/dL — ABNORMAL LOW (ref 0.3–1.2)
Total Protein: 5.5 g/dL — ABNORMAL LOW (ref 6.5–8.1)

## 2020-01-03 LAB — URINALYSIS, ROUTINE W REFLEX MICROSCOPIC
Bilirubin Urine: NEGATIVE
Glucose, UA: NEGATIVE mg/dL
Hgb urine dipstick: NEGATIVE
Ketones, ur: NEGATIVE mg/dL
Leukocytes,Ua: NEGATIVE
Nitrite: NEGATIVE
Protein, ur: NEGATIVE mg/dL
Specific Gravity, Urine: 1.018 (ref 1.005–1.030)
pH: 6 (ref 5.0–8.0)

## 2020-01-03 MED ORDER — CYCLOBENZAPRINE HCL 10 MG PO TABS: 10.0000 mg | ORAL_TABLET | Freq: Three times a day (TID) | ORAL | 0 refills | Status: DC | PRN

## 2020-01-03 NOTE — Discharge Instructions (Signed)

## 2020-01-03 NOTE — MAU Note (Signed)
Presents with c/o lower back and abdominal pain and headache.  States has been having pain x1 week.  Also c/o decreased FM.  Denies VB or LOF.

## 2020-01-03 NOTE — MAU Provider Note (Signed)
History     CSN: [id number]  Arrival date and time: 01/03/20 1031    First Provider Initiated Contact with Patient 01/03/20 1130        Chief Complaint  Patient presents with  . Back Pain  . Abdominal Pain  . Headache   HPI This is a 29 yo G4P2012 at [redacted]w[redacted]d who presents with lower back pain that started about 10 days ago and has been increasing since. Pain sharp at times, radiates up back into shoulders. Occasionally has abdominal pain as well. No fevers, chills. Has decreased appetite. Fell in kitchen about 3-4 weeks ago, but back pain started a few weeks after that. Movement increases pain.  OB History    Gravida  4   Para  2   Term  2   Preterm      AB  1   Living  2     SAB      TAB  1   Ectopic      Multiple  0   Live Births  1           Past Medical History:  Diagnosis Date  . Abnormal Pap smear   . Hypokalemia     Past Surgical History:  Procedure Laterality Date  . COLPOSCOPY  10/2011  . NO PAST SURGERIES      Family History  Problem Relation Age of Onset  . Cancer Father     Social History   Tobacco Use  . Smoking status: Never Smoker  . Smokeless tobacco: Never Used  Vaping Use  . Vaping Use: Never used  Substance Use Topics  . Alcohol use: No  . Drug use: No    Allergies: No Known Allergies  Medications Prior to Admission  Medication Sig Dispense Refill Last Dose  . ibuprofen (ADVIL) 600 MG tablet Take 1 tablet (600 mg total) by mouth every 6 (six) hours. 30 tablet 0  at not taking  . oxyCODONE-acetaminophen (PERCOCET/ROXICET) 5-325 MG tablet Take 1 tablet by mouth every 6 (six) hours as needed (pain scale less than 5 give 1, pain scale >5 patient may have 2). 10 tablet 0  at not taking    Review of Systems Physical Exam   Blood pressure (!) 112/55, pulse 81, temperature 98.5 F (36.9 C), temperature source Oral, resp. rate 18, height 5' 0.5" (1.537 m), weight 53.8 kg, SpO2 100 %, unknown if currently  breastfeeding.  Physical Exam Vitals reviewed.  Constitutional:      Appearance: She is well-developed.  Cardiovascular:     Rate and Rhythm: Normal rate and regular rhythm.  Pulmonary:     Effort: Pulmonary effort is normal.     Breath sounds: Normal breath sounds.  Abdominal:     General: Abdomen is flat. There is no distension or abdominal bruit. There are no signs of injury.     Palpations: Abdomen is soft.     Tenderness: There is no abdominal tenderness.     Hernia: No hernia is present.     Comments: Fundal height approximately 23 weeks. Mild right round ligament tenderness.  Musculoskeletal:       Arms:  Neurological:     Mental Status: She is alert.    Results for orders placed or performed during the hospital encounter of 01/03/20 (from the past 24 hour(s))  Urinalysis, Routine w reflex microscopic Urine, Clean Catch     Status: Abnormal   Collection Time: 01/03/20 11:01 AM  Result Value Ref Range  Color, Urine YELLOW YELLOW   APPearance HAZY (A) CLEAR   Specific Gravity, Urine 1.018 1.005 - 1.030   pH 6.0 5.0 - 8.0   Glucose, UA NEGATIVE NEGATIVE mg/dL   Hgb urine dipstick NEGATIVE NEGATIVE   Bilirubin Urine NEGATIVE NEGATIVE   Ketones, ur NEGATIVE NEGATIVE mg/dL   Protein, ur NEGATIVE NEGATIVE mg/dL   Nitrite NEGATIVE NEGATIVE   Leukocytes,Ua NEGATIVE NEGATIVE  Comprehensive metabolic panel     Status: Abnormal   Collection Time: 01/03/20 11:44 AM  Result Value Ref Range   Sodium 136 135 - 145 mmol/L   Potassium 3.7 3.5 - 5.1 mmol/L   Chloride 105 98 - 111 mmol/L   CO2 25 22 - 32 mmol/L   Glucose, Bld 76 70 - 99 mg/dL   BUN 5 (L) 6 - 20 mg/dL   Creatinine, Ser 5.40 0.44 - 1.00 mg/dL   Calcium 8.4 (L) 8.9 - 10.3 mg/dL   Total Protein 5.5 (L) 6.5 - 8.1 g/dL   Albumin 2.9 (L) 3.5 - 5.0 g/dL   AST 16 15 - 41 U/L   ALT 8 0 - 44 U/L   Alkaline Phosphatase 52 38 - 126 U/L   Total Bilirubin 0.2 (L) 0.3 - 1.2 mg/dL   GFR, Estimated >98 >11 mL/min   Anion  gap 6 5 - 15     MAU Course  Procedures NST: reassuring for gestational age  MDM   Assessment and Plan     ICD-10-CM   1. [redacted] weeks gestation of pregnancy  Z3A.23   2. Back pain affecting pregnancy in second trimester  O99.891    M54.9    Flexeril prescribed.  NO evidence of preterm labor.  NST reassuring and appropriate for GA>  Levie Heritage 01/03/2020, 11:27 AM

## 2020-01-18 DIAGNOSIS — Z3480 Encounter for supervision of other normal pregnancy, unspecified trimester: Secondary | ICD-10-CM | POA: Diagnosis not present

## 2020-01-18 LAB — OB RESULTS CONSOLE RPR: RPR: NONREACTIVE

## 2020-01-30 ENCOUNTER — Inpatient Hospital Stay (HOSPITAL_COMMUNITY)
Admission: AD | Admit: 2020-01-30 | Discharge: 2020-01-30 | Disposition: A | Discharge status: Home / Self Care | Payer: Medicaid Other | Attending: Obstetrics and Gynecology | Admitting: Obstetrics and Gynecology

## 2020-01-30 ENCOUNTER — Other Ambulatory Visit: Payer: Self-pay

## 2020-01-30 ENCOUNTER — Encounter (HOSPITAL_COMMUNITY): Payer: Self-pay | Admitting: Obstetrics and Gynecology

## 2020-01-30 DIAGNOSIS — O98513 Other viral diseases complicating pregnancy, third trimester: Secondary | ICD-10-CM

## 2020-01-30 DIAGNOSIS — O212 Late vomiting of pregnancy: Secondary | ICD-10-CM | POA: Diagnosis present

## 2020-01-30 DIAGNOSIS — M545 Low back pain, unspecified: Secondary | ICD-10-CM | POA: Diagnosis not present

## 2020-01-30 DIAGNOSIS — O26892 Other specified pregnancy related conditions, second trimester: Secondary | ICD-10-CM | POA: Diagnosis not present

## 2020-01-30 DIAGNOSIS — U071 COVID-19: Secondary | ICD-10-CM

## 2020-01-30 DIAGNOSIS — O98512 Other viral diseases complicating pregnancy, second trimester: Secondary | ICD-10-CM | POA: Diagnosis not present

## 2020-01-30 DIAGNOSIS — Z3A27 27 weeks gestation of pregnancy: Secondary | ICD-10-CM | POA: Diagnosis not present

## 2020-01-30 HISTORY — DX: Trichomoniasis, unspecified: A59.9

## 2020-01-30 LAB — URINALYSIS, ROUTINE W REFLEX MICROSCOPIC
Bilirubin Urine: NEGATIVE
Glucose, UA: NEGATIVE mg/dL
Hgb urine dipstick: NEGATIVE
Ketones, ur: 20 mg/dL — AB
Leukocytes,Ua: NEGATIVE
Nitrite: NEGATIVE
Protein, ur: NEGATIVE mg/dL
Specific Gravity, Urine: 1.021 (ref 1.005–1.030)
pH: 7 (ref 5.0–8.0)

## 2020-01-30 LAB — CBC WITH DIFFERENTIAL/PLATELET
Abs Immature Granulocytes: 0.05 10*3/uL (ref 0.00–0.07)
Basophils Absolute: 0 10*3/uL (ref 0.0–0.1)
Basophils Relative: 0 %
Eosinophils Absolute: 0 10*3/uL (ref 0.0–0.5)
Eosinophils Relative: 0 %
HCT: 33.2 % — ABNORMAL LOW (ref 36.0–46.0)
Hemoglobin: 12 g/dL (ref 12.0–15.0)
Immature Granulocytes: 1 %
Lymphocytes Relative: 6 %
Lymphs Abs: 0.5 10*3/uL — ABNORMAL LOW (ref 0.7–4.0)
MCH: 34.2 pg — ABNORMAL HIGH (ref 26.0–34.0)
MCHC: 36.1 g/dL — ABNORMAL HIGH (ref 30.0–36.0)
MCV: 94.6 fL (ref 80.0–100.0)
Monocytes Absolute: 1.1 10*3/uL — ABNORMAL HIGH (ref 0.1–1.0)
Monocytes Relative: 13 %
Neutro Abs: 6.5 10*3/uL (ref 1.7–7.7)
Neutrophils Relative %: 80 %
Platelets: 133 10*3/uL — ABNORMAL LOW (ref 150–400)
RBC: 3.51 MIL/uL — ABNORMAL LOW (ref 3.87–5.11)
RDW: 12.3 % (ref 11.5–15.5)
WBC: 8.2 10*3/uL (ref 4.0–10.5)
nRBC: 0 % (ref 0.0–0.2)

## 2020-01-30 LAB — COMPREHENSIVE METABOLIC PANEL
ALT: 23 U/L (ref 0–44)
AST: 33 U/L (ref 15–41)
Albumin: 3.2 g/dL — ABNORMAL LOW (ref 3.5–5.0)
Alkaline Phosphatase: 72 U/L (ref 38–126)
Anion gap: 11 (ref 5–15)
BUN: 7 mg/dL (ref 6–20)
CO2: 19 mmol/L — ABNORMAL LOW (ref 22–32)
Calcium: 8.8 mg/dL — ABNORMAL LOW (ref 8.9–10.3)
Chloride: 107 mmol/L (ref 98–111)
Creatinine, Ser: 0.68 mg/dL (ref 0.44–1.00)
GFR, Estimated: >60 mL/min (ref >60)
Glucose, Bld: 91 mg/dL (ref 70–99)
Potassium: 3 mmol/L — ABNORMAL LOW (ref 3.5–5.1)
Sodium: 137 mmol/L (ref 135–145)
Total Bilirubin: 0.6 mg/dL (ref 0.3–1.2)
Total Protein: 6.1 g/dL — ABNORMAL LOW (ref 6.5–8.1)

## 2020-01-30 LAB — RESP PANEL BY RT-PCR (FLU A&B, COVID) ARPGX2
Influenza A by PCR: NEGATIVE
Influenza B by PCR: NEGATIVE
SARS Coronavirus 2 by RT PCR: POSITIVE — AB

## 2020-01-30 LAB — FETAL FIBRONECTIN: Fetal Fibronectin: NEGATIVE

## 2020-01-30 MED ORDER — NIFEDIPINE 10 MG PO CAPS
10.0000 mg | ORAL_CAPSULE | ORAL | Status: DC | PRN
  Administered 2020-01-30: 10 mg via ORAL
  Filled 2020-01-30: qty 1

## 2020-01-30 MED ORDER — CYCLOBENZAPRINE HCL 5 MG PO TABS
10.0000 mg | ORAL_TABLET | Freq: Once | ORAL | Status: AC
  Administered 2020-01-30: 10 mg via ORAL
  Filled 2020-01-30: qty 2

## 2020-01-30 MED ORDER — DIPHENHYDRAMINE HCL 50 MG/ML IJ SOLN
50.0000 mg | Freq: Once | INTRAMUSCULAR | Status: AC | PRN
  Administered 2020-01-30: 12:00:00 50 mg via INTRAVENOUS
  Filled 2020-01-30: qty 1

## 2020-01-30 MED ORDER — SODIUM CHLORIDE 0.9 % IV SOLN
INTRAVENOUS | Status: DC | PRN
  Administered 2020-01-30: 1000 mL via INTRAVENOUS

## 2020-01-30 MED ORDER — FAMOTIDINE IN NACL 20-0.9 MG/50ML-% IV SOLN
20.0000 mg | Freq: Once | INTRAVENOUS | Status: AC | PRN
  Administered 2020-01-30: 12:00:00 20 mg via INTRAVENOUS
  Filled 2020-01-30: qty 50

## 2020-01-30 MED ORDER — LACTATED RINGERS IV BOLUS
1000.0000 mL | Freq: Once | INTRAVENOUS | Status: AC
  Administered 2020-01-30: 1000 mL via INTRAVENOUS

## 2020-01-30 MED ORDER — SODIUM CHLORIDE 0.9 % IV SOLN
Freq: Once | INTRAVENOUS | Status: AC
  Filled 2020-01-30: qty 5

## 2020-01-30 MED ORDER — METHYLPREDNISOLONE SODIUM SUCC 125 MG IJ SOLR
125.0000 mg | Freq: Once | INTRAMUSCULAR | Status: AC | PRN
  Administered 2020-01-30: 12:00:00 125 mg via INTRAVENOUS
  Filled 2020-01-30: qty 2

## 2020-01-30 MED ORDER — ALBUTEROL SULFATE HFA 108 (90 BASE) MCG/ACT IN AERS
2.0000 | INHALATION_SPRAY | Freq: Once | RESPIRATORY_TRACT | Status: DC | PRN
  Filled 2020-01-30: qty 6.7

## 2020-01-30 MED ORDER — EPINEPHRINE 0.3 MG/0.3ML IJ SOAJ
0.3000 mg | Freq: Once | INTRAMUSCULAR | Status: DC | PRN
  Filled 2020-01-30: qty 0.6

## 2020-01-30 MED ORDER — ONDANSETRON HCL 4 MG/2ML IJ SOLN: 4.0000 mg | Freq: Once | INTRAMUSCULAR | Status: DC

## 2020-01-30 NOTE — MAU Note (Signed)
Security called, pt walked out of unit muttering. Can hear her yelling in lobby. Pt had gotten angry and started yelling at [place] "we didn't help her at all"

## 2020-01-30 NOTE — MAU Note (Signed)
Stressing self isolation to pt.  Mask at home, stay away from people.  Do not go out shopping, etc.  Pt asked "so can I go to work tomorrow", again said NO, YOU HAVE COVID, you can NOT be around people, this is how people are getting sick.

## 2020-01-30 NOTE — MAU Note (Signed)
NS infusing.  Explained meds will be given, pt states no longer feeling tightness in chest. Pt dozing, resp unlabored, o2 sat 100%  CNM aware- will proceed with reaction meds.

## 2020-01-30 NOTE — MAU Provider Note (Addendum)
History     CSN: [id number]  Arrival date and time: 01/30/20 8527   Event Date/Time   First Provider Initiated Contact with Patient 01/30/20 [phone]      Chief Complaint  Patient presents with  . Abdominal Pain  . Back Pain  . Leg Pain  . Rupture of Membranes   Sara Arroyo is a 29 y.o. [id number] at [redacted]w[redacted]d who presents today with nausea/vomiting/diarrhea and body aches since very early this morning 01/30/2020. She reports that she has constant pain in her back that radiates down her legs. She started with nausea/vomiting yesterday. She has had multiple episodes of vomiting and diarrhea.   Back Pain This is a new problem. The current episode started yesterday. The problem occurs constantly. The problem is unchanged. The pain is present in the lumbar spine. The pain radiates to the right thigh and left thigh. The pain is at a severity of 10/10. Pertinent negatives include no dysuria, fever or pelvic pain. Risk factors include pregnancy. She has tried nothing for the symptoms.    OB History    Gravida  4   Para  2   Term  2   Preterm      AB  1   Living  2     SAB      IAB  1   Ectopic      Multiple  0   Live Births  1           Past Medical History:  Diagnosis Date  . Abnormal Pap smear   . Hypokalemia   . Trichomonas infection     Past Surgical History:  Procedure Laterality Date  . COLPOSCOPY  10/2011  . NO PAST SURGERIES      Family History  Problem Relation Age of Onset  . Cancer Father   . Healthy Mother     Social History   Tobacco Use  . Smoking status: Never Smoker  . Smokeless tobacco: Never Used  Vaping Use  . Vaping Use: Never used  Substance Use Topics  . Alcohol use: No  . Drug use: No    Allergies: No Known Allergies  Medications Prior to Admission  Medication Sig Dispense Refill Last Dose  . cyclobenzaprine (FLEXERIL) 10 MG tablet Take 1 tablet (10 mg total) by mouth 3 (three) times daily as needed for muscle spasms.  30 tablet 0     Review of Systems  Constitutional: Negative for chills and fever.  Gastrointestinal: Positive for diarrhea, nausea and vomiting.  Genitourinary: Negative for dysuria, frequency, pelvic pain, vaginal bleeding and vaginal discharge.  Musculoskeletal: Positive for back pain.   Physical Exam   Blood pressure (!) 106/51, pulse 86, temperature 99 F (37.2 C), temperature source Oral, resp. rate 18, SpO2 100 %, unknown if currently breastfeeding.  Physical Exam Vitals and nursing note reviewed. Exam conducted with a chaperone present.  Constitutional:      General: She is in acute distress.  HENT:     Head: Normocephalic.  Cardiovascular:     Rate and Rhythm: Normal rate.  Pulmonary:     Effort: Pulmonary effort is normal.  Abdominal:     Palpations: Abdomen is soft.     Tenderness: There is no abdominal tenderness.  Genitourinary:    Comments: Cervix: Dilation: Fingertip Effacement (%): Thick Exam by:: Mathews Robinsons CNM  Skin:    General: Skin is warm and dry.  Neurological:     Mental Status: She is alert and oriented to  person, place, and time.  Psychiatric:        Mood and Affect: Mood normal.        Behavior: Behavior normal.    NST:  Baseline: 150 Variability: moderate Accels: 10x10 Decels: none Toco: none Reactive/Appropriate for GA    Results for orders placed or performed during the hospital encounter of 01/30/20 (from the past 24 hour(s))  Fetal fibronectin     Status: None   Collection Time: 01/30/20  8:23 AM  Result Value Ref Range   Fetal Fibronectin NEGATIVE NEGATIVE  Urinalysis, Routine w reflex microscopic Urine, Clean Catch     Status: Abnormal   Collection Time: 01/30/20  8:23 AM  Result Value Ref Range   Color, Urine YELLOW YELLOW   APPearance HAZY (A) CLEAR   Specific Gravity, Urine 1.021 1.005 - 1.030   pH 7.0 5.0 - 8.0   Glucose, UA NEGATIVE NEGATIVE mg/dL   Hgb urine dipstick NEGATIVE NEGATIVE   Bilirubin Urine NEGATIVE  NEGATIVE   Ketones, ur 20 (A) NEGATIVE mg/dL   Protein, ur NEGATIVE NEGATIVE mg/dL   Nitrite NEGATIVE NEGATIVE   Leukocytes,Ua NEGATIVE NEGATIVE  Resp Panel by RT-PCR (Flu A&B, Covid) Nasopharyngeal Swab     Status: Abnormal   Collection Time: 01/30/20  8:23 AM   Specimen: Nasopharyngeal Swab; Nasopharyngeal(NP) swabs in vial transport medium  Result Value Ref Range   SARS Coronavirus 2 by RT PCR POSITIVE (A) NEGATIVE   Influenza A by PCR NEGATIVE NEGATIVE   Influenza B by PCR NEGATIVE NEGATIVE    MAU Course  Procedures  MDM Reviewed +Covid results with patient. Offered Mab infusion. Patient is agreeable to receiving Mab infusion today. Fact sheet given.    11:48 AM patient got up to use the bathroom and reported some chest tightness. On arrival to the patient's room she reports that it was better. Mab infusion stopped. RN spoke the the RN at the infusion center. Plan to follow protocol that includes steroids, pepcid and benadryl and then resume infusion at a slower rate.   1:59 PM Mab infusion has completed. No further incidents noted. Patient tolerated the rest of the infusion well. Will DC home with supportive care and list of safe OTC meds to take.  Covid isolation procedures reviewed with patient. She is to self-isolate at home for 10 days from today as symptoms started early this morning. Patient concerned about 31 year old and 31 year old. She thought that children couldn't get covid. Advised that children can get covid. I recommended patient to wear a mask when she is around her children, and to increase ventilation at home with either open windows when possible or HVAC circulation.   3:07 PM patient left the unit very upset. She doesn't feel like we did anything for her. She states that she came here because her back hurt and her back still hurts. She wants to know why she had to sit here for 8 hours with [place] not doing anything for her to make her better. I informed patient that we  have treated her pain and she was feeling better earlier and that she has gotten a Mab infusion. She states that the Mab didn't make her feel better yet so she doesn't think it worked. She states that she should have gone to [facility] because they would probably do more for her. Patient is also upset that she has to isolate for 10 days. She states that she already told her manager that she had  covid and he told her that it didn't matter that she needed to go back to work tomorrow. Patient works at [employer] and states that she will be back at work tomorrow regardless of how she feels and regardless of her isolation period. I encouraged patient to please not go to work. That she needs to rest, hydrate and take care of herself at home so that she can feel better. Patient continued to express her anger and left the unit.    3:19 PM Patient left, but continued to have a discussion with the security guard in the MAU lobby. She was very clearly upset as she left.    Assessment and Plan   1. COVID-19 affecting pregnancy in third trimester   2. [redacted] weeks gestation of pregnancy    DC home Comfort measures reviewed  Covid isolation precautions reviewed Patient received MAB infusion here today  PTL precautions  Fetal kick counts RX: none  Return to MAU as needed FU with OB as planned   Follow-up Information    Ob/Gyn, [facility] Follow up.   Contact information: [address] [place] [address] [phone]              Thressa Sheller DNP, CNM  01/30/20  2:07 PM

## 2020-01-30 NOTE — MAU Note (Signed)
When getting pt settled into bed.  Pt reports flushed feeling and a tightness in chest.  No SOB, resp not labored.  o2 sat and pulse remain stable- Casirivimab infusion stopped, called for provider

## 2020-01-30 NOTE — MAU Note (Signed)
Pt pacing in rm when entered, continues to complain of leg pain. CNM again offering IV fluids and pain medications, rationale explained.

## 2020-01-30 NOTE — MAU Note (Signed)
Pt up at side of bed when entered rm, strip was reviewed by CNM- removed [place], toco adjusted, explained monitoring to pt, cont to complain of pain in her legs.  Explained ? Related to ctxs.  Waiting on lab results.  Encouraged p.o. fluids.  abd soft on palp.

## 2020-01-30 NOTE — MAU Note (Signed)
Bedside sono repeated due to inability of tracing FH- +FM, +cardiac activity.  The ? Leakage was on the way here when she was throwing up.  Reports n/v/d started during the night, every time she went to the bathroom, she would throw up and have diarrhea.  Pain meds and IV fluids discussed- pt refused

## 2020-01-30 NOTE — MAU Note (Signed)
Pt called out- 'extreme pain has returned, it is excruciating".  Provider informed.

## 2020-01-30 NOTE — MAU Note (Signed)
Pain started at 0100, abd around to low back and down her legs.  Pain is constant, can't get comfortable.  "Never felt anything like this except right before she had her son last year, she is not due until March.... it is too soon."

## 2020-01-30 NOTE — Discharge Instructions (Signed)
Person Under Monitoring Name: Sara Arroyo  Location: [address] [place] [address]   Infection Prevention Recommendations for Individuals Confirmed to have, or Being Evaluated for, 2019 Novel Coronavirus (COVID-19) Infection Who Receive Care at Home  Individuals who are confirmed to have, or are being evaluated for, COVID-19 should follow the prevention steps below until a healthcare provider or local or state health department says they can return to normal activities.  Stay home except to get medical care You should restrict activities outside your home, except for getting medical care. Do not go to work, school, or public areas, and do not use public transportation or taxis.  Call ahead before visiting your doctor Before your medical appointment, call the healthcare provider and tell them that you have, or are being evaluated for, COVID-19 infection. This will help the healthcare provider's office take steps to keep other people from getting infected. Ask your healthcare provider to call the local or state health department.  Monitor your symptoms Seek prompt medical attention if your illness is worsening (e.g., difficulty breathing). Before going to your medical appointment, call the healthcare provider and tell them that you have, or are being evaluated for, COVID-19 infection. Ask your healthcare provider to call the local or state health department.  Wear a facemask You should wear a facemask that covers your nose and mouth when you are in the same room with other people and when you visit a healthcare provider. People who live with or visit you should also wear a facemask while they are in the same room with you.  Separate yourself from other people in your home As much as possible, you should stay in a different room from other people in your home. Also, you should use a separate bathroom, if available.  Avoid sharing household items You should not  share dishes, drinking glasses, cups, eating utensils, towels, bedding, or other items with other people in your home. After using these items, you should wash them thoroughly with soap and water.  Cover your coughs and sneezes Cover your mouth and nose with a tissue when you cough or sneeze, or you can cough or sneeze into your sleeve. Throw used tissues in a lined trash can, and immediately wash your hands with soap and water for at least 20 seconds or use an alcohol-based hand rub.  Wash your [employer] your hands often and thoroughly with soap and water for at least 20 seconds. You can use an alcohol-based hand sanitizer if soap and water are not available and if your hands are not visibly dirty. Avoid touching your eyes, nose, and mouth with unwashed hands.   Prevention Steps for Caregivers and Household Members of Individuals Confirmed to have, or Being Evaluated for, COVID-19 Infection Being Cared for in the Home  If you live with, or provide care at home for, a person confirmed to have, or being evaluated for, COVID-19 infection please follow these guidelines to prevent infection:  Follow healthcare provider's instructions Make sure that you understand and can help the patient follow any healthcare provider instructions for all care.  Provide for the patient's basic needs You should help the patient with basic needs in the home and provide support for getting groceries, prescriptions, and other personal needs.  Monitor the patient's symptoms If they are getting sicker, call his or her medical provider and tell them that the patient has, or is being evaluated for, COVID-19 infection. This will help the healthcare provider's office  take steps to keep other people from getting infected. Ask the healthcare provider to call the local or state health department.  Limit the number of people who have contact with the patient  If possible, have only one caregiver for the  patient.  Other household members should stay in another home or place of residence. If this is not possible, they should stay  in another room, or be separated from the patient as much as possible. Use a separate bathroom, if available.  Restrict visitors who do not have an essential need to be in the home.  Keep older adults, very young children, and other sick people away from the patient Keep older adults, very young children, and those who have compromised immune systems or chronic health conditions away from the patient. This includes people with chronic heart, lung, or kidney conditions, diabetes, and cancer.  Ensure good ventilation Make sure that shared spaces in the home have good air flow, such as from an air conditioner or an opened window, weather permitting.  Wash your hands often  Wash your hands often and thoroughly with soap and water for at least 20 seconds. You can use an alcohol based hand sanitizer if soap and water are not available and if your hands are not visibly dirty.  Avoid touching your eyes, nose, and mouth with unwashed hands.  Use disposable paper towels to dry your hands. If not available, use dedicated cloth towels and replace them when they become wet.  Wear a facemask and gloves  Wear a disposable facemask at all times in the room and gloves when you touch or have contact with the patient's blood, body fluids, and/or secretions or excretions, such as sweat, saliva, sputum, nasal mucus, vomit, urine, or feces.  Ensure the mask fits over your nose and mouth tightly, and do not touch it during use.  Throw out disposable facemasks and gloves after using them. Do not reuse.  Wash your hands immediately after removing your facemask and gloves.  If your personal clothing becomes contaminated, carefully remove clothing and launder. Wash your hands after handling contaminated clothing.  Place all used disposable facemasks, gloves, and other waste in a lined  container before disposing them with other household waste.  Remove gloves and wash your hands immediately after handling these items.  Do not share dishes, glasses, or other household items with the patient  Avoid sharing household items. You should not share dishes, drinking glasses, cups, eating utensils, towels, bedding, or other items with a patient who is confirmed to have, or being evaluated for, COVID-19 infection.  After the person uses these items, you should wash them thoroughly with soap and water.  Wash laundry thoroughly  Immediately remove and wash clothes or bedding that have blood, body fluids, and/or secretions or excretions, such as sweat, saliva, sputum, nasal mucus, vomit, urine, or feces, on them.  Wear gloves when handling laundry from the patient.  Read and follow directions on labels of laundry or clothing items and detergent. In general, wash and dry with the warmest temperatures recommended on the label.  Clean all areas the individual has used often  Clean all touchable surfaces, such as counters, tabletops, doorknobs, bathroom fixtures, toilets, phones, keyboards, tablets, and bedside tables, every day. Also, clean any surfaces that may have blood, body fluids, and/or secretions or excretions on them.  Wear gloves when cleaning surfaces the patient has come in contact with.  Use a diluted bleach solution (e.g., dilute bleach with 1 part  bleach and 10 parts water) or a household disinfectant with a label that says EPA-registered for coronaviruses. To make a bleach solution at home, add 1 tablespoon of bleach to 1 quart (4 cups) of water. For a larger supply, add  cup of bleach to 1 gallon (16 cups) of water.  Read labels of cleaning products and follow recommendations provided on product labels. Labels contain instructions for safe and effective use of the cleaning product including precautions you should take when applying the product, such as wearing gloves or  eye protection and making sure you have good ventilation during use of the product.  Remove gloves and wash hands immediately after cleaning.  Monitor yourself for signs and symptoms of illness Caregivers and household members are considered close contacts, should monitor their health, and will be asked to limit movement outside of the home to the extent possible. Follow the monitoring steps for close contacts listed on the symptom monitoring form.   ? If you have additional questions, contact your local health department or call the epidemiologist on call at [phone] (available 24/7). ? This guidance is subject to change. For the most up-to-date guidance from CDC, please refer to their website: [employer] Safe Medications in Pregnancy   Acne: Benzoyl Peroxide Salicylic Acid  Backache/Headache: Tylenol: 2 regular strength every 4 hours OR              2 Extra strength every 6 hours  Colds/Coughs/Allergies: Benadryl (alcohol free) 25 mg every 6 hours as needed Breath right strips Claritin Cepacol throat lozenges Chloraseptic throat spray Cold-Eeze- up to three times per day Cough drops, alcohol free Flonase (by prescription only) Guaifenesin Mucinex Robitussin DM (plain only, alcohol free) Saline nasal spray/drops Sudafed (pseudoephedrine) & Actifed ** use only after [redacted] weeks gestation and if you do not have high blood pressure Tylenol Vicks Vaporub Zinc lozenges Zyrtec   Constipation: Colace Ducolax suppositories Fleet enema Glycerin suppositories Metamucil Milk of magnesia Miralax Senokot Smooth move tea  Diarrhea: Kaopectate Imodium A-D  *NO pepto Bismol  Hemorrhoids: Anusol Anusol HC Preparation H Tucks  Indigestion: Tums Maalox Mylanta Zantac  Pepcid  Insomnia: Benadryl (alcohol free) 25mg  every 6 hours as needed Tylenol PM Unisom, no Gelcaps  Leg  Cramps: Tums MagGel  Nausea/Vomiting:  Bonine Dramamine Emetrol Ginger extract Sea bands Meclizine  Nausea medication to take during pregnancy:  Unisom (doxylamine succinate 25 mg tablets) Take one tablet daily at bedtime. If symptoms are not adequately controlled, the dose can be increased to a maximum recommended dose of two tablets daily (1/2 tablet in the morning, 1/2 tablet mid-afternoon and one at bedtime). Vitamin B6 100mg  tablets. Take one tablet twice a day (up to 200 mg per day).  Skin Rashes: Aveeno products Benadryl cream or 25mg  every 6 hours as needed Calamine Lotion 1% cortisone cream  Yeast infection: Gyne-lotrimin 7 Monistat 7   **If taking multiple medications, please check labels to avoid duplicating the same active ingredients **take medication as directed on the label ** Do not exceed 4000 mg of tylenol in 24 hours **Do not take medications that contain aspirin or ibuprofen

## 2020-01-30 NOTE — MAU Note (Signed)
Assisted pt up to bathroom  

## 2020-01-30 NOTE — MAU Note (Signed)
Awakened when entered rm.   Denies pain, has been able to sleep. No c/o. Asking when she can leave.

## 2020-01-30 NOTE — MAU Note (Signed)
Spoke with Toniann Fail in pharmacy regarding pt reaction to Casirivimab. Symptoms were gone prior to giving Benadryl.  Spoke with Meghan H that works in infusion center and with [facility].Marland KitchenMarland KitchenMarland Kitchen. will proceed with completion of Casirivimab infusion at slower rate.

## 2020-01-30 NOTE — MAU Note (Signed)
Pt assisted up to bathroom, no problems or complaints while up.

## 2020-02-05 NOTE — L&D Delivery Note (Signed)
Delivery Note Sara Arroyo is a [id number] at [redacted]w[redacted]d who had a spontaneous delivery at 15:38 a viable female was delivered via ROA.  APGAR: 8, 9. Weight 5lb 11oz (2580g).    Admitted for induction of labor for fetal growth restriction. Induced with pitocin and AROM. Progressed normally. Pushed for 4 minutes. Baby was delivered without difficulty. No nuchal cord. Baby placed on maternal abdomen. Delayed cord clamping for 60 seconds. Delivery of placenta was spontaneous. Placenta was found to be intact, 3-vessel cord was noted. The fundus was found to be firm. No lacerations. Estimated blood loss 75cc. Instrument and gauze counts were correct at the end of the procedure.  Placenta status: pathology Mom to postpartum.  Baby to Couplet care / Skin to Skin.  [name] 04/19/2020, 3:59 PM

## 2020-04-06 DIAGNOSIS — Z3480 Encounter for supervision of other normal pregnancy, unspecified trimester: Secondary | ICD-10-CM | POA: Diagnosis not present

## 2020-04-06 LAB — OB RESULTS CONSOLE GBS: GBS: POSITIVE

## 2020-04-10 DIAGNOSIS — O365931 Maternal care for other known or suspected poor fetal growth, third trimester, fetus 1: Secondary | ICD-10-CM | POA: Diagnosis not present

## 2020-04-10 DIAGNOSIS — Z3A37 37 weeks gestation of pregnancy: Secondary | ICD-10-CM | POA: Diagnosis not present

## 2020-04-17 DIAGNOSIS — Z3A38 38 weeks gestation of pregnancy: Secondary | ICD-10-CM | POA: Diagnosis not present

## 2020-04-17 DIAGNOSIS — O365931 Maternal care for other known or suspected poor fetal growth, third trimester, fetus 1: Secondary | ICD-10-CM | POA: Diagnosis not present

## 2020-04-19 ENCOUNTER — Encounter (HOSPITAL_COMMUNITY): Payer: Self-pay | Admitting: Obstetrics & Gynecology

## 2020-04-19 ENCOUNTER — Inpatient Hospital Stay (HOSPITAL_COMMUNITY): Payer: Medicaid Other

## 2020-04-19 ENCOUNTER — Inpatient Hospital Stay (HOSPITAL_COMMUNITY)
Admission: AD | Admit: 2020-04-19 | Discharge: 2020-04-21 | DRG: 807 | Disposition: A | Discharge status: Home / Self Care | Payer: Medicaid Other | Attending: Obstetrics & Gynecology | Admitting: Obstetrics & Gynecology

## 2020-04-19 ENCOUNTER — Other Ambulatory Visit: Payer: Self-pay

## 2020-04-19 DIAGNOSIS — Z3A38 38 weeks gestation of pregnancy: Secondary | ICD-10-CM

## 2020-04-19 DIAGNOSIS — O365930 Maternal care for other known or suspected poor fetal growth, third trimester, not applicable or unspecified: Secondary | ICD-10-CM | POA: Diagnosis not present

## 2020-04-19 DIAGNOSIS — O365990 Maternal care for other known or suspected poor fetal growth, unspecified trimester, not applicable or unspecified: Secondary | ICD-10-CM | POA: Diagnosis present

## 2020-04-19 DIAGNOSIS — F4322 Adjustment disorder with anxiety: Secondary | ICD-10-CM

## 2020-04-19 DIAGNOSIS — O43813 Placental infarction, third trimester: Secondary | ICD-10-CM | POA: Diagnosis not present

## 2020-04-19 DIAGNOSIS — Z8616 Personal history of COVID-19: Secondary | ICD-10-CM | POA: Diagnosis not present

## 2020-04-19 DIAGNOSIS — O99824 Streptococcus B carrier state complicating childbirth: Secondary | ICD-10-CM | POA: Diagnosis present

## 2020-04-19 DIAGNOSIS — F6381 Intermittent explosive disorder: Secondary | ICD-10-CM

## 2020-04-19 LAB — CBC
HCT: 37.9 % (ref 36.0–46.0)
Hemoglobin: 13 g/dL (ref 12.0–15.0)
MCH: 33.2 pg (ref 26.0–34.0)
MCHC: 34.3 g/dL (ref 30.0–36.0)
MCV: 96.9 fL (ref 80.0–100.0)
Platelets: 130 10*3/uL — ABNORMAL LOW (ref 150–400)
RBC: 3.91 MIL/uL (ref 3.87–5.11)
RDW: 12.8 % (ref 11.5–15.5)
WBC: 6 10*3/uL (ref 4.0–10.5)
nRBC: 0 % (ref 0.0–0.2)

## 2020-04-19 LAB — RPR: RPR Ser Ql: NONREACTIVE

## 2020-04-19 LAB — TYPE AND SCREEN
ABO/RH(D): O POS
Antibody Screen: NEGATIVE

## 2020-04-19 MED ORDER — BISACODYL 10 MG RE SUPP: 10.0000 mg | Freq: Every day | RECTAL | Status: DC | PRN

## 2020-04-19 MED ORDER — IBUPROFEN 600 MG PO TABS
600.0000 mg | ORAL_TABLET | Freq: Four times a day (QID) | ORAL | Status: DC
  Administered 2020-04-19 – 2020-04-20 (×4): 600 mg via ORAL
  Filled 2020-04-19 (×7): qty 1

## 2020-04-19 MED ORDER — TERBUTALINE SULFATE 1 MG/ML IJ SOLN: 0.2500 mg | Freq: Once | INTRAMUSCULAR | Status: DC | PRN

## 2020-04-19 MED ORDER — SENNOSIDES-DOCUSATE SODIUM 8.6-50 MG PO TABS
2.0000 | ORAL_TABLET | ORAL | Status: DC
  Administered 2020-04-20: 2 via ORAL
  Filled 2020-04-19 (×2): qty 2

## 2020-04-19 MED ORDER — WITCH HAZEL-GLYCERIN EX PADS: 1.0000 "application " | MEDICATED_PAD | CUTANEOUS | Status: DC | PRN

## 2020-04-19 MED ORDER — SODIUM CHLORIDE 0.9% FLUSH: 3.0000 mL | Freq: Two times a day (BID) | INTRAVENOUS | Status: DC

## 2020-04-19 MED ORDER — ONDANSETRON HCL 4 MG PO TABS: 4.0000 mg | ORAL_TABLET | ORAL | Status: DC | PRN

## 2020-04-19 MED ORDER — DIPHENHYDRAMINE HCL 25 MG PO CAPS: 25.0000 mg | ORAL_CAPSULE | Freq: Four times a day (QID) | ORAL | Status: DC | PRN

## 2020-04-19 MED ORDER — OXYCODONE HCL 5 MG PO TABS: 10.0000 mg | ORAL_TABLET | ORAL | Status: DC | PRN

## 2020-04-19 MED ORDER — SIMETHICONE 80 MG PO CHEW: 80.0000 mg | CHEWABLE_TABLET | ORAL | Status: DC | PRN

## 2020-04-19 MED ORDER — SODIUM CHLORIDE 0.9 % IV SOLN: 250.0000 mL | INTRAVENOUS | Status: DC | PRN

## 2020-04-19 MED ORDER — FENTANYL-BUPIVACAINE-NACL 0.5-0.125-0.9 MG/250ML-% EP SOLN
12.0000 mL/h | EPIDURAL | Status: DC | PRN
  Filled 2020-04-19: qty 250

## 2020-04-19 MED ORDER — BENZOCAINE-MENTHOL 20-0.5 % EX AERO: 1.0000 "application " | INHALATION_SPRAY | CUTANEOUS | Status: DC | PRN

## 2020-04-19 MED ORDER — ONDANSETRON HCL 4 MG/2ML IJ SOLN
4.0000 mg | Freq: Four times a day (QID) | INTRAMUSCULAR | Status: DC | PRN
  Administered 2020-04-19: 4 mg via INTRAVENOUS
  Filled 2020-04-19: qty 2

## 2020-04-19 MED ORDER — FLEET ENEMA 7-19 GM/118ML RE ENEM: 1.0000 | ENEMA | Freq: Every day | RECTAL | Status: DC | PRN

## 2020-04-19 MED ORDER — PRENATAL MULTIVITAMIN CH
1.0000 | ORAL_TABLET | Freq: Every day | ORAL | Status: DC
  Filled 2020-04-19: qty 1

## 2020-04-19 MED ORDER — FENTANYL CITRATE (PF) 100 MCG/2ML IJ SOLN
50.0000 ug | INTRAMUSCULAR | Status: DC | PRN
  Administered 2020-04-19: 100 ug via INTRAVENOUS
  Filled 2020-04-19: qty 2

## 2020-04-19 MED ORDER — TETANUS-DIPHTH-ACELL PERTUSSIS 5-2.5-18.5 LF-MCG/0.5 IM SUSY: 0.5000 mL | PREFILLED_SYRINGE | Freq: Once | INTRAMUSCULAR | Status: DC

## 2020-04-19 MED ORDER — PHENYLEPHRINE 40 MCG/ML (10ML) SYRINGE FOR IV PUSH (FOR BLOOD PRESSURE SUPPORT): 80.0000 ug | PREFILLED_SYRINGE | INTRAVENOUS | Status: DC | PRN

## 2020-04-19 MED ORDER — OXYTOCIN-SODIUM CHLORIDE 30-0.9 UT/500ML-% IV SOLN: 2.5000 [IU]/h | INTRAVENOUS | Status: DC

## 2020-04-19 MED ORDER — EPHEDRINE 5 MG/ML INJ: 10.0000 mg | INTRAVENOUS | Status: DC | PRN

## 2020-04-19 MED ORDER — ONDANSETRON HCL 4 MG/2ML IJ SOLN: 4.0000 mg | INTRAMUSCULAR | Status: DC | PRN

## 2020-04-19 MED ORDER — DIPHENHYDRAMINE HCL 50 MG/ML IJ SOLN: 12.5000 mg | INTRAMUSCULAR | Status: DC | PRN

## 2020-04-19 MED ORDER — OXYCODONE-ACETAMINOPHEN 5-325 MG PO TABS
1.0000 | ORAL_TABLET | ORAL | Status: DC | PRN
Start: 2020-04-19 — End: 2020-04-19

## 2020-04-19 MED ORDER — DIBUCAINE (PERIANAL) 1 % EX OINT: 1.0000 "application " | TOPICAL_OINTMENT | CUTANEOUS | Status: DC | PRN

## 2020-04-19 MED ORDER — ACETAMINOPHEN 325 MG PO TABS: 650.0000 mg | ORAL_TABLET | ORAL | Status: DC | PRN

## 2020-04-19 MED ORDER — OXYCODONE HCL 5 MG PO TABS
5.0000 mg | ORAL_TABLET | ORAL | Status: DC | PRN
Start: 2020-04-19 — End: 2020-04-21
  Administered 2020-04-19 – 2020-04-20 (×4): 5 mg via ORAL
  Filled 2020-04-19 (×4): qty 1

## 2020-04-19 MED ORDER — COCONUT OIL OIL
1.0000 | TOPICAL_OIL | Status: DC | PRN
Start: 2020-04-19 — End: 2020-04-21

## 2020-04-19 MED ORDER — PENICILLIN G POT IN DEXTROSE 60000 UNIT/ML IV SOLN
3.0000 10*6.[IU] | INTRAVENOUS | Status: DC
  Administered 2020-04-19: 3 10*6.[IU] via INTRAVENOUS
  Filled 2020-04-19: qty 50

## 2020-04-19 MED ORDER — ACETAMINOPHEN 325 MG PO TABS
650.0000 mg | ORAL_TABLET | ORAL | Status: DC | PRN
  Administered 2020-04-19 – 2020-04-20 (×3): 650 mg via ORAL
  Filled 2020-04-19 (×4): qty 2

## 2020-04-19 MED ORDER — OXYTOCIN BOLUS FROM INFUSION
333.0000 mL | Freq: Once | INTRAVENOUS | Status: AC
  Administered 2020-04-19: 333 mL via INTRAVENOUS

## 2020-04-19 MED ORDER — LIDOCAINE HCL (PF) 1 % IJ SOLN
30.0000 mL | INTRAMUSCULAR | Status: DC | PRN
  Filled 2020-04-19: qty 30

## 2020-04-19 MED ORDER — LACTATED RINGERS IV SOLN: 500.0000 mL | INTRAVENOUS | Status: DC | PRN

## 2020-04-19 MED ORDER — OXYTOCIN-SODIUM CHLORIDE 30-0.9 UT/500ML-% IV SOLN
1.0000 m[IU]/min | INTRAVENOUS | Status: DC
  Administered 2020-04-19: 2 m[IU]/min via INTRAVENOUS
  Filled 2020-04-19: qty 500

## 2020-04-19 MED ORDER — SODIUM CHLORIDE 0.9% FLUSH: 3.0000 mL | INTRAVENOUS | Status: DC | PRN

## 2020-04-19 MED ORDER — MISOPROSTOL 25 MCG QUARTER TABLET
25.0000 ug | ORAL_TABLET | ORAL | Status: DC | PRN
  Filled 2020-04-19: qty 1

## 2020-04-19 MED ORDER — OXYCODONE-ACETAMINOPHEN 5-325 MG PO TABS
2.0000 | ORAL_TABLET | ORAL | Status: DC | PRN
Start: 2020-04-19 — End: 2020-04-19

## 2020-04-19 MED ORDER — SODIUM CHLORIDE 0.9 % IV SOLN
5.0000 10*6.[IU] | Freq: Once | INTRAVENOUS | Status: AC
  Administered 2020-04-19: 5 10*6.[IU] via INTRAVENOUS
  Filled 2020-04-19: qty 5

## 2020-04-19 MED ORDER — LACTATED RINGERS IV SOLN: 500.0000 mL | Freq: Once | INTRAVENOUS | Status: DC

## 2020-04-19 MED ORDER — SOD CITRATE-CITRIC ACID 500-334 MG/5ML PO SOLN: 30.0000 mL | ORAL | Status: DC | PRN

## 2020-04-19 MED ORDER — LACTATED RINGERS IV SOLN: INTRAVENOUS | Status: DC

## 2020-04-19 NOTE — Progress Notes (Addendum)
RN went to room after NT called out for help in the room. When entering the room, pt is verbally attacking the NT complaining about her care she has been provided with so far in regards to her baby and herself. The NT was changing the baby's diaper, and the mom said the baby is cold and he is unwrapped and that the mom had poop all over her because the way the baby's diaper had been put on and that "I am OCD". The pt proceeded to say that "she did this a year ago at this hospital and she was treated the same way, when can she go home where she can take care of the baby the way she wants to". RN offered to call the [facility] for her and apologized for the care she was receiving. RN called [facility] and the pt proceeded to call out at the desk yelling over the phone for her nurse and "if her nurse doesn't get in there right now she was going to call the [facility] police department". I called pt's RN and let her know what the patient requested and when the patient's RN, Britta Mccreedy went to the room the patient could be heard yelling down the hall and smacking her hands.

## 2020-04-19 NOTE — H&P (Signed)
Sara Arroyo is a 30 y.o. female [phone] [redacted]w[redacted]d presenting for IOL due to fetal growth restriction. She reports no LOF, VB, Contractions. Normal FM.   Pregnancy c/b: 1. FGR: diagnosed 04/10/2020 at [redacted]w[redacted]d 2516 gm (5#9) 11.4% AC 2.3% 2. H/o COVID: positive 01/30/2020 3. Abnormal pap smear: declined colpo in pregnancy, will discuss at postpartum visit  OB History    Gravida  4   Para  2   Term  2   Preterm      AB  1   Living  2     SAB      IAB  1   Ectopic      Multiple  0   Live Births  1          Past Medical History:  Diagnosis Date  . Abnormal Pap smear   . Hypokalemia   . Trichomonas infection    Past Surgical History:  Procedure Laterality Date  . COLPOSCOPY  10/2011  . NO PAST SURGERIES     Family History: family history includes Cancer in her father and maternal grandmother; Healthy in her mother. Social History:  reports that she has never smoked. She has never used smokeless tobacco. She reports previous drug use. Drug: Marijuana. She reports that she does not drink alcohol.     Maternal Diabetes: No Genetic Screening: Normal panorama low risk Maternal Ultrasounds/Referrals: Normal anatomy, posterior placenta Fetal Ultrasounds or other Referrals:  None Maternal Substance Abuse:  no Significant Maternal Medications: none Significant Maternal Lab Results:  Group B Strep positive Other Comments:  None  Review of Systems Per HPI Exam Physical Exam  Dilation: 3 Effacement (%): 60 Station: -2 Exam by:: CDerrill Kay, RNC Blood pressure (!) 105/51, pulse 61, temperature 97.9 F (36.6 C), temperature source Axillary, resp. rate 18, height 4\' 9"  (1.448 m), weight 58.8 kg, unknown if currently breastfeeding. NAD, resting comfortably Gravid abdomen Fetal testing: FHR 130, Cat 1. Toco quiet  Prenatal labs: ABO, Rh:  --/--/O POS (03/16 [birth year]-1983) Antibody: NEG (03/16 12-11-1983) Rubella: Immune (09/14 0000) RPR: Nonreactive (12/14 0000)  HBsAg: Negative  (09/14 0000)  HIV: Non-reactive (09/14 0000)  GBS: Positive/-- (03/03 0000)   Hematology Recent Labs  Lab 04/19/20 0832  WBC 6.0  RBC 3.91  HGB 13.0  HCT 37.9  MCV 96.9  MCH 33.2  MCHC 34.3  RDW 12.8  PLT 130*   Assessment/Plan: Sara Arroyo is a 30 y.o. female [phone] [redacted]w[redacted]d admitted for planned IOL due to FGR.  1. Delivery: on admission SVE 3/60/-2. Plan for pitocin/AROM prn GBS positive, on PCN 2. FGR: diagnosed 04/10/2020 at [redacted]w[redacted]d 2516 gm (5#9) 11.4% AC 2.3% 3. H/o COVID: positive 01/30/2020 4. Abnormal pap smear: declined colpo in pregnancy, will discuss at postpartum visit 5. Vaccines: declined tdap, COVID, flu vaccines in pregnancy  Sara Arroyo 04/19/2020, 11:02 AM

## 2020-04-19 NOTE — Progress Notes (Signed)
OBGYN Note S: comfortable, declines epidural O: BP 118/73   Pulse 71   Temp 97.9 F (36.6 C) (Axillary)   Resp 18   Ht 4\' 9"  (1.448 m)   Wt 58.8 kg   BMI 28.05 kg/m  FHR 130, Cat 1 Toco q101m SVE 3/60/-2, AROM c/f Sara Arroyo 30 y.o. 33 IOL FGR at [redacted]w[redacted]d -Cont pitocin Javarie Crisp K Taam-Akelman 04/19/20 1:59 PM

## 2020-04-19 NOTE — Progress Notes (Signed)
NT answered the nurse call light and the mom said "I need a nurse, for my baby!" NT asked if everything was ok to clarify the situation. [name] stated that the baby had pooped everywhere and that it was all down his legs, up his back, and between his toes. NT told mom that she would send in someone to help. When the NT went into the room, mom was visibly upset and opened the blanket to show that the babies pamper had come off. NT tried to reassure mom that it was okay and that she would help to get the baby clean. [name] also wanted her linen changed because she said that there was poop everywhere. NT offered to go grab fresh linen but the mom declined stating "I want my baby taken care of first". NT put on gloves and handed the wipes and diaper to mom. Both the NT and [name] started to clean the baby while he was laying in her lap. While cleaning [name] became even more upset stating "He's still got poop all over him and y'all aren't helping! I'm starting to get real upset about how y'all are treating Sara Arroyo." NT again tried to comfort her by saying that it was okay and that we would get him cleaned up. NT removed the soiled heel stick bandage and wiped the foot before leaving to grab a new bandage. When returning mom was very upset saying that the baby was cold and how he was still covered in poop. She called the nursing station again saying "Her baby was cold and that someone needed to get in there right now!" Shemo, RN walked into the room as the NT was moving the baby to the bassinet with a fresh blanket and diaper. [name] let the RN know that she did not like how the NT was handling the baby and that I was putting dirty linen and diaper on the baby. The RN looked at the baby while the NT had the baby on his side wiping where the mom said there was poop. NT did not observe any poop on the baby. While talking to the RN the mom expressed that there was also poop all over her and her bedding and she threw the top linen on the floor.  NT swaddled baby and picked up linen to put away. [name] yelled to the RN that that the NT had left the baby laying there and that if we didn't get someone in there that she would call [facility] police department. RN offered to give the baby to mom and call the charge nurse. NT left the room and called the charge nurse. While at the desk [facility], RN instructed the NT to call security because of what she could hear down the hall. [facility] was also called.

## 2020-04-19 NOTE — Progress Notes (Signed)
Called to patient room for service recovery.  Patient very upset because she stated staff was not helping and was mistreating baby. Patient was still very upset, she would not make eye contact with anyone in the room.  She kept saying she wanted to leave.  Was given information about the option to leave AMA.  Educated that normal discharge if all is well is 24hrs. And that the doctors made that decision.  Patient kept saying there was stool everywhere.  NT, charge RN and I was in the room.  No stool seen. Patient wanted baby wrapped then, then she unwrapped baby.   Was agreeable to limit different staff from being in her room.  Patient agreeable that staff would have to round on her and baby.  Patient given patient experience card.  Patient calmer upon [place] leaving the room, but still upset.  Stated that if that other tech came back she would make another scene and she knew we remembered her from her last child,and that she was upset with care then also.

## 2020-04-20 LAB — CBC
HCT: 34.2 % — ABNORMAL LOW (ref 36.0–46.0)
Hemoglobin: 12.1 g/dL (ref 12.0–15.0)
MCH: 33.5 pg (ref 26.0–34.0)
MCHC: 35.4 g/dL (ref 30.0–36.0)
MCV: 94.7 fL (ref 80.0–100.0)
Platelets: 135 10*3/uL — ABNORMAL LOW (ref 150–400)
RBC: 3.61 MIL/uL — ABNORMAL LOW (ref 3.87–5.11)
RDW: 12.6 % (ref 11.5–15.5)
WBC: 12.2 10*3/uL — ABNORMAL HIGH (ref 4.0–10.5)
nRBC: 0 % (ref 0.0–0.2)

## 2020-04-20 NOTE — Clinical Social Work Maternal (Signed)
CLINICAL SOCIAL WORK MATERNAL/CHILD NOTE  Patient Details  Name: Sara Arroyo MRN: [id number] Date of Birth: 10/13/1990  Date:  04/20/2020  Clinical Social Worker Initiating Note:  Manfred Arch, MSW, [place] Date/Time: Initiated:  04/20/20/0930     Child's Name:  Sara Arroyo   Biological Parents:  Mother,Father Sara Arroyo 11/09/1976)   Need for Interpreter:  None   Reason for Referral:  Current Substance Use/Substance Use During Pregnancy    Address:  [address] [place] [facility] [address]    Phone number:  [phone] (home)     Additional phone number:   Household Members/Support Persons (HM/SP):   Household Member/Support Person 1,Household Member/Support Person 2,Household Member/Support Person 3   HM/SP Name Relationship DOB or Age  HM/SP -1 Sara Arroyo Significant Other 11/09/1976  HM/SP -2 Sara Arroyo Son 09/04/2018  HM/SP -3 Sara Arroyo Daughter 02/27/2010  HM/SP -4        HM/SP -5        HM/SP -6        HM/SP -7        HM/SP -8          Natural Supports (not living in the home):  Immediate Family   Professional Supports: None   Employment: Full-time   Type of Work: [employer]   Education:  Some Materials engineer arranged:    Surveyor, quantity Resources:  [employer]   Other Resources:  Sales executive ,[employer]   Cultural/Religious Considerations Which May Impact Care:    Strengths:  Ability to meet basic needs ,Pediatrician chosen,Home prepared for child    Psychotropic Medications:         Pediatrician:    Armed forces operational officer area  Pediatrician List:   [facility] [employer]    [place]    [place] [facility]      Pediatrician Fax Number:    Risk Factors/Current Problems:  Substance Use    Cognitive State:  Linear Thinking ,Alert    Mood/Affect:  Interested ,Calm    CSW Assessment: CSW consulted for "r/o high violence." CSW reviewed chart and noted THC  use. In the process of going to see [name], CSW was informed that [name] was irate and security was in the process of being called to the room. CSW also informed that [name] contacted [employer]. CSW could hear [name] yelling in her room, down the hall. Once [employer], security, and RN were bedside, CSW entered room to also meet with [name]. CSW waited for [name] to speak with [employer], RN and her MD Dr. Tenny Craw. CSW listened as [name] explained to staff that she feels her infant's care has not properly been communicated to her. [name] expressed that staff have come in to perform procedures, without giving her an explanation. Dr. Tenny Craw and RN answered all of [name] questions regarding both her and infant's care. [name] expressed understanding and denied having any additional questions following the conversation with Dr. Tenny Craw and RN. During the conversation, [name] sister entered the room and [name] provided permission to have her stay. Prior to [employer] leaving, CSW asked [name] for permission to complete assessment, [name] agreed. Everyone exited the room and at that time [name] mother Sara Arroyo entered the room. CSW asked [name] permission to speak with her alone. [name] stated her sister could remain in room and her mother could leave. [name] mom exited without a problem.   Throughout the time spent in room, CSW observed  [name] appropriately consoling and comforting infant. CSW informed [name] of reason for consult, regarding THC use. [name] reported she did not find out about her pregnancy until she was 6 months pregnant. [name] stated she was using marijuana prior to knowing, but stopped once she confirmed the pregnancy. [name] stated she started having Deberah Pelton in January or February, causing her to slip up and use marijuana again. [name] expressed she was unable to eat or get comfort from the pain, and utilized marijuana one time to cope. [name] reproted she does not use alcohol, smoke cigarettes, or use other drugs. CSW expressed understanding and informed [name] of the hospital drug screen policy.  [name] was informed that an UDS and CDS is performed on infant. [name] stated she saw staff place cotton balls in the infants diaper when she came to the floor yesterday and she asked why there were being placed. [name] reported she was told "to catch the urine" and not provided anymore explanation. [name] stated "I'm not dumb, I just wanted for her to tell me why." CSW acknowledged [name] feelings of wanting to be provided more details. CSW asked [name] if she kept the cotton balls in the diapers. [name] stated she only threw cotton balls out once, when she was told to throw them out due to having stool all over them. CSW informed [name] a CPS report is required if infant test positive for substances and asked [name] if she has any previous history. [name] stated she had CPS involvement for [facility] use when her son was born in 2020. [name] denies any additional history.  [name] reported she lives with FOB and her two children. [name] works full time at [employer], receiving both [employer] and food stamp resources. [name] denies having any mental health history but disclosed she has diagnosed herself with OCD. [name] provided the example of having to reorganize her tray table after she was brought items. [name] identified her mom and sister as her main supports. [name] expressed she has a strong support system. [name] denies any current SI, HI or domestic violence.   CSW provided education regarding the baby blues period versus PPD and provided resources. CSW provided the New Mom Checklist and encouraged [name] to self evaluate and contact a medical professional if symptoms are noted at any time.  CSW provided review of Sudden Infant Death Syndrome (SIDS) precautions.  [name] reported she all essentials for infant, including a bassinet. [name] has identified a pediatrician and denies any barriers to care. [name] declined any additional referrals or resources at this time. A referral for [facility] will be made via CPS if a report is made.  During assessment with CSW, [name] did not display any  acute mental health symptoms and interacted appropriately with infant.  CSW will continue to follow UDS/CDS and make a CPS report if warranted. CSW identifies no further need for intervention and no barriers to discharge at this time.  CSW Plan/Description:  No Further Intervention Required/No Barriers to Discharge,Sudden Infant Death Syndrome (SIDS) Education,Perinatal Mood and Anxiety Disorder (PMADs) Education,Hospital Drug Screen Policy Information,Child Protective Service Report ,CSW Will Continue to Monitor Umbilical Cord Tissue Drug Screen Results and Make Report if Warranted,Other Information/Referral to [employer], [place] 04/20/2020, 11:47 AM

## 2020-04-20 NOTE — Progress Notes (Signed)
Mother called out stating, "they didn't do their effing jobs! My babies diapers were left across the room and he's going to need to be changed again tonight. The light was left on and I need to go to the bathroom now" NT and AD went to assist patient. Patient was agitated and stated she was in pain and that," I could have someone bring me pills in 5 minutes." Mother expressed confusion over medications. Mother also stated, " I have a lawsuit from the last time I delivered, try me." as well as " I am trying hard to not put my hands on people because if I did not have my son in my arms I would of."

## 2020-04-20 NOTE — Progress Notes (Signed)
Walked into pt's room and introduced myself as her new nurse and pt was visibly upset and hostile.  Asked pt why she was so upset and what is really going on because she has been yelling and cussing at all the nurses and can be heard from the other end of the hall.  Pt proceeded to tell me that she feels overwhelmed and that she feels like she is not being heard. She is worried that due to her positive drug screen and prior history, CPS may try to take her baby.  She has stated several times that she just wants to go home.  Explained to pt that she has to communicate better with the staff and make a list of questions she has so that she can better understand care plan for baby and what process will take place.  Baby has not been feeding well and was IUGR, so he started off small already. Baby is more than likely being kept for this reason and not because we are trying to trap her into keeping her baby due to + screen.  Pt verbalizes understanding and states she will make an effort to try and communicate better.

## 2020-04-20 NOTE — Progress Notes (Signed)
Pt pressed the call bell for the nurse to change the infant's diaper. Upon entering the room, the infant was lying unwrapped between the patient's legs. The patient began to raise her voice, stating that the infant was covered in urine because the diaper was too large and the blankets were now wet. The RN informed the patient that she will go get preemie diapers and fresh blankets. The patient responded, "My blood is boiling. Someone better do something before I go off," while rocking back and forth in her bed. When the nurse returned, she handed the patient a diaper. The patient changed the diaper, and the nurse explained that she would add cotton balls for the [facility] labs. The patient appeared annoyed, and began to complain about the baby being cold. The nurse checked the baby's temperature, which was 98.18F, and proceeded to swaddle the infant. The patient began to yell, stating that she did not like how the nurse was handling the infant. The nurse asked the patient to explain what was wrong. The patient stated that she could see the infant's chest, and she could tell that he is cold. The nurse explained that his temperatures have been normal, and that it is normal to see a portion of skin if the infant has pulled his hands out of the swaddle. The nurse offered to show the patient how to swaddle the infant herself. The patient declined, stating that it is the nurse's job, and she will call another nurse into the room until her baby is swaddled correctly. The patient told the nurse to leave "before I have to throw hands - I don't care if y'all call CPS." The nurse offered the patient her scheduled dose of motrin before leaving. The patient declined, saying "I will wait until you leave, so I can make you come back in 30 seconds." The nurse exited the patient's room, and shift supervisors were notified.

## 2020-04-20 NOTE — Progress Notes (Signed)
Pt was calling out to the nurses desk for assistance, when I approached the patient in the room she was sitting in the bed actively changing the baby's diaper.  She was visibly upset and was yelling and cursing that no one was helping her or her baby.  She stated that the baby had a diaper rash and I offered to call the doctor to get an order for some A&D ointment to apply to the buttocks with diaper changes if she felt a rash was present.  Pt continued to yell and scream that the nurses here were no help and were not helping her baby who was in the bed with her lying calmly.  She then proceeded to call 911 and told the dispatcher that she was at the hospital and needed help, while changing the baby's diaper.  I continued to ask what I could do at this time to help her and she yelled to put the baby's diaper on, however, she had just placed the new diaper on and it was secure and in place on the baby.  She still continued to yell to help with the diaper and I assured her that the diaper was on and secure and there was no need at this time to mess with the diaper. I placed baby in the crib and wrapped baby in a clean blanket and gave baby back to mom to hold.  At that time [place] with house coverage came into the room to talk with patient.

## 2020-04-20 NOTE — Social Work (Signed)
CSW contacted by pediatrician L.Rafeek and informed [name] has continued to display concerning behavior. Pediatrician stated she attempted to speak with [name] to reiterate that infant will not be discharged today and [name] became tearful and expressed she wants to go home. Pediatrician stated [name] was unable to recall details of the conversation she had with her MD earlier in the day, when she was informed of why they would be discharging tomorrow at the earliest. Pediatrician expressed concern regarding [name] behaviors and infant discharging with [name]. CSW informed a psychiatric consult has been entered. CSW agreed to follow-up with [name] to assess her current mental state.   CSW was able to make contact with [name] and was informed [name] has no current CPS case and has not had a case at all this year. This information reflects what was reported by [name].   CSW met with [name] bedside to assess. CSW observed [name] holding infant, in the process of feeding him a bottle. [name] was also listening to music. CSW asked [name] how she has been doing since this morning. [name] reported she is doing okay. [name] expressed she is ready to go home and tired of being in the hospital . [name] reported "I've never been to jail but this feels like jail. That's why I keep looking out the window." [name] expressed she feels infant will feed better once he gets home. CSW reminded [name] of the conversation with Dr. Harrington Challenger this morning, where she stated infant will not be going home today due to his feeding and weight. [name] acknowledged the conversation. CSW empathized with [name] wanting to go home but expressed the importance of interacting appropriately with staff. CSW explained that although she has not been observed interacting appropriately with infant, the behaviors she has displayed could lead to concern when it comes to discharging with infant. [name] nodded her head in understanding and stated "I would never talk to my baby how I talk to them." CSW  expressed understanding and reiterated the importance of speaking with staff in a polite manner. CSW reminded [name] to ask questions if she has concerns regarding infant's care. CSW asked if her supports (mom and sister) would be returning. [name] reported her sister will likely be returning. [name] shared she prefers to be alone at times to allow her to think.   CSW again asked about [name] about her mental health history, considering she shared with the MD that she has been to a therapist. [name] disclosed she went to a therapist after losing her grandmother in 2018 and her father in 2020. [name] stated "it's just been a lot." [name] shared the therapist suggested medication to deal with her anxiety but that she did not want to take it. [name] stated "why should I take medication to make me feel a different way." CSW suggested [name] try therapy again to discuss her losses, as well as any other stressors. [name] was understanding and stated Dr. Harrington Challenger made that suggestion. CSW asked [name] about the psychiatric evaluation she discussed with Dr. Harrington Challenger. [name] stated she was informed of the psychiatric evaluation and thought the pediatrician was there to talk to her about it. CSW explained it would be a different staff member to complete the evaluation.  CSW asked [name] if she thinks she will be okay tonight. [name] reported "I'm going to be quiet. They aren't going to hear anything else out of me." [name] was laughing and smiling as she made the statement. CSW again encouraged [name] to engage appropriately  with staff throughout the night. CSW agreed to follow-up with [name] in the morning to see how her night went. [name] was in agreement and thankful.   Sara Arroyo, MSW, LCSWA Clinical Social Work [facility] [phone]  

## 2020-04-20 NOTE — Progress Notes (Signed)
Patient ID: Sara Arroyo, female   DOB: [birth year], 30 y.o.   MRN: [id number]  I returned to speak with the patient one-on-one approximately an hour and a half after the initial conversation.  At this time her family members had left and it was just myself and the patient and the baby in the room.  Again, the patient engaged me in conversation and was reasonable and receptive.  I asked the patient if she would be receptive and willing to have a consultation with a psychiatric care professional.  Her initial reaction was "y'all think I am crazy"?  I discussed with her that my interactions with her have been very reasonable.  That she has been thoughtful and responsive.  However, her earlier behavior in the day had presented concerns by hospital staff and a consultation with a psychiatric provider may help [place] identify resources to assist her in successfully caring for herself, her baby, and her family.  She endorsed previously working with a therapist.  She states that therapist suggested that she may benefit from medication.  She stopped seeing this therapist because of the suggestion of potentially needing medication.  This scared her.  She did endorse recognition that her earlier behavior in the day could come across as "crazy" and she can understand why healthcare providers might be concerned.  She wanted to stress that her family is her support system and that her mother especially called her out on her earlier poor behavior and suggested that she needed to apologize to the hospital staff or the way that she behaved.  She demonstrated remorse.  She acknowledged that her father had issues with mental health and she is often been told that she resembles her father.  She also acknowledged that she has had questions about whether she were bipolar.  I discussed with her that she may find a psychiatric consultation beneficial.  She might gain insight into some of her behavior.  I also discussed with her that  medications may be suggested but ultimately the decision is hers whether or not to take them.  However medication can be very beneficial to help her maintain control of her emotions.  She stated she was willing to do what ever she needed to do to be able to go home with her baby.

## 2020-04-20 NOTE — Progress Notes (Signed)
Post Partum Day 1 Subjective: Pain well controlled, tolerating po, voiding without difficulty  Overnight, the patient was reportedly becoming increasingly more agitated and belligerent with hospital staff.  She stated she felt like nobody was helping with her care, however when care was offered she was often refusing care.  There were episodes of yelling that were disturbing other patients and staff.  During the day the situation escalated to the point where hospital security was called and the patient herself called 911.  Officers arrived.  I interviewed the patient in the presence of the patient's nurse Herbert Seta, to [facility] officers, the patient's sister, 2 staff social workers.  During this time the patient was appropriate, calm, communicative, and receptive  Objective: Blood pressure (!) 96/54, pulse 60, temperature 98.2 F (36.8 C), temperature source Oral, resp. rate 18, height 4\' 9"  (1.448 m), weight 58.8 kg, SpO2 99 %, unknown if currently breastfeeding.  Physical Exam:  General: alert, cooperative and appears stated age Lochia: appropriate   Recent Labs    04/19/20 0832 04/20/20 0506  HGB 13.0 12.1  HCT 37.9 34.2*    Assessment/Plan: Routine PP care Declines Circ  Long and detailed conversation with the patient with the above named individuals present.  I felt this conversation was very productive.  The patient expressed that she felt there was a lack of communication between hospital staff and her explaining what things were being done to and for her and her baby and what the medical rationale and reasoning behind these treatments were.  She felt that she was frustrated because she was not understanding the reasons for certain treatments.  She admits getting frustrated and losing her temper.  We had a long conversation about the initial reasons for her admission due to growth restriction of the baby.  Y induction of labor is recommended in this context.  We discussed why a baby  who is felt to be growth restricted needs to be monitored for a longer period of time after delivery.  We discussed that from a pediatric standpoint babies with a lower birthweight need to demonstrate stabilization of their weight and ability to feed well prior to discharge.  I discussed with her in ideal circumstances a baby in this context would likely stay for at least 48 hours.  I also had a frank discussion with her regarding her prior behavior and how it raised concerns for the hospital staff regarding her ability to care for the baby once she was discharged home.  The patient and I engaged in a calm and reasonable conversation.  She endorsed understanding of the current treatment plan and the rationale behind why it was felt the baby would need to stay for at least 48 hours.  I did offer her the ability to be discharged as a patient and she could remain in the hospital as a baby patient.  She wished to remain a patient herself.  All questions were answered and concerns were addressed.   LOS: 1 day   04/22/20 04/20/2020, 3:04 PM

## 2020-04-20 NOTE — Progress Notes (Signed)
Bedside report complete at 0815. I remained in the room to answer questions and address needs. I encouraged patient to order breakfast; patient stated she didn't feel like eating. I brought juice, ice water and graham crackers to the room; I informed patient that food orders can take 30 min to an hour to be delivered. All questions were answered and patient was told to call if she needed anything. Patient verbalized understanding.

## 2020-04-21 DIAGNOSIS — F4322 Adjustment disorder with anxiety: Secondary | ICD-10-CM

## 2020-04-21 DIAGNOSIS — F6381 Intermittent explosive disorder: Secondary | ICD-10-CM

## 2020-04-21 MED ORDER — IBUPROFEN 600 MG PO TABS: 600.0000 mg | ORAL_TABLET | Freq: Four times a day (QID) | ORAL | 0 refills | Status: DC

## 2020-04-21 NOTE — Progress Notes (Signed)
Entered patient's room as her new nurse mid shift, to find her extremely agitated, so much so that she was shaking. She stated that she keeps firing her nurses because she is not getting perfect care for her baby and she just wants to go home She consistently used foul language and maintained yelling throughout communication. Explained to pt that she will be able to go home once the care providers write a discharge order when she and infant are medically sound, otherwise, if she leaves, she will be going against medical advice.

## 2020-04-21 NOTE — Progress Notes (Addendum)
For the first 3 hours of this shift, pt was calm, well mannered and was able to communicate effectively to the RN on her needs. Pt openly allowed RN complete her routine assessment and obtain her vital signs. In the first 3 hours, pt rarely called out and had no issues with the RN. Pt openly allowed RN to assess her infant, complete infant's PKU, change her infant and assist with bottle feedings. As RN was about the exit the room, randomly the pt got extremely agitated with how RN swaddled her infant, stating "it was too tight and she wanted it looser for her baby to move his hands". RN re-swaddled the infant looser and asked the pt if that swaddle was better. The pt balled up her fist and state "why can't ya'll do anything right, your always wrapping him too tight--he's uncomfortable". RN offered to re-swaddle her infant for the third time, loosening it even more and asked if the swaddle was better this time. However, at this point the pt became even more irate. RN tried consoling the pt, nothing seemed to help. RN ensured the pt  And infant were safe and left the room politely. RN notified her charge nurse after the incident. The charge nurse went into assess the situation and the pt demanded a different RN.     Herbert Moors, RN

## 2020-04-21 NOTE — Consult Note (Signed)
[facility]   Reason for Consult:  Agitation  Referring Physician:  Waynard ReedsKendra ross, MD  Patient Identification: Sara MoatsBrittany K Guizar MRN:  [id number] Principal Diagnosis: IUGR (intrauterine growth restriction) affecting care of mother Diagnosis:  Principal Problem:   IUGR (intrauterine growth restriction) affecting care of mother   Total Time spent with patient: 1 hour  HPI:   Sara MoatsBrittany K Aicher is a 30 y.o. female with PMHx of abnormal Pap smear, trichomonas infection admitted as a G4P201 1918w3d admitted for planned IOL due to FGR.  She tested positive for COVID-19 infection.  She had a spontaneous delivery at 3:38 a viable female on 04/19/2020.  Psychiatry is consulted because of patient's extremely agitated and threatening behavior towards healthcare workers. Today patient denies any suicidal or homicidal ideations.  Patient states she has these residual events which she is unable to control and she is verbally abusive towards other people but she has never been physically abusive towards anyone.  She states she has these mood swings where at one she is feeling really happy and then at that time she gets really sad.  She states she has had outpatient therapy when her grandmother and father died in 2018 and 2020 respectively.  She states she is still grieving and its difficult for her adjusting in life without her grandmother and father.  She admits to mood swings and anger outburst.  She has never tried to hurt herself previously or anybody else around her.  Collateral Information: Mother via telephone.  Mother states patient has never reacted in such way in her life and this is the first time and she understands its because of all the stress of premature baby.  She states at baseline patient is a calm person and she would not benefit from any psychiatric needs. Past Psychiatric History: No known  past psychiatric history  Risk to Self: Is patient at risk for suicide?: No  denies Risk to Others:  No Prior Inpatient Therapy:  No Prior Outpatient Therapy:  Grief counseling therapy  Past Medical History:  Past Medical History:  Diagnosis Date  . Abnormal Pap smear   . Hypokalemia   . Trichomonas infection     Past Surgical History:  Procedure Laterality Date  . COLPOSCOPY  10/2011  . NO PAST SURGERIES     Family History:  Family History  Problem Relation Age of Onset  . Cancer Father   . Healthy Mother   . Cancer Maternal Grandmother    Family Psychiatric  History: Patient reports bipolar disorder in father. Social History:  Social History   Substance and Sexual Activity  Alcohol Use No     Social History   Substance and Sexual Activity  Drug Use Not Currently  . Types: Marijuana    Social History   Socioeconomic History  . Marital status: Single    Spouse name: Not on file  . Number of children: Not on file  . Years of education: Not on file  . Highest education level: Not on file  Occupational History  . Not on file  Tobacco Use  . Smoking status: Never Smoker  . Smokeless tobacco: Never Used  Vaping Use  . Vaping Use: Never used  Substance and Sexual Activity  . Alcohol use: No  . Drug use: Not Currently    Types: Marijuana  . Sexual activity: Yes  Other Topics Concern  . Not on file  Social History Narrative  . Not on file   Social Determinants of  Health   Financial Resource Strain: Not on file  Food Insecurity: Not on file  Transportation Needs: Not on file  Physical Activity: Not on file  Stress: Not on file  Social Connections: Not on file   Additional Social History: Patient lives with her children's father and works at [employer].    Allergies:  No Known Allergies  Labs:  Results for orders placed or performed during the hospital encounter of 04/19/20 (from the past 48 hour(s))  CBC     Status: Abnormal   Collection Time: 04/20/20  5:06 AM  Result Value Ref Range   WBC 12.2 (H) 4.0 - 10.5 K/uL   RBC 3.61  (L) 3.87 - 5.11 MIL/uL   Hemoglobin 12.1 12.0 - 15.0 g/dL   HCT 37.1 (L) 69.6 - 78.9 %   MCV 94.7 80.0 - 100.0 fL   MCH 33.5 26.0 - 34.0 pg   MCHC 35.4 30.0 - 36.0 g/dL   RDW 38.1 01.7 - 51.0 %   Platelets 135 (L) 150 - 400 K/uL   nRBC 0.0 0.0 - 0.2 %    Comment: Performed at [facility], [address] [place], [place] [address]    Current Facility-Administered Medications  Medication Dose Route Frequency Provider Last Rate Last Admin  . sodium chloride flush (NS) 0.9 % injection 3 mL  3 mL Intravenous Q12H Taam-Akelman, Rosalea K, MD       And  . sodium chloride flush (NS) 0.9 % injection 3 mL  3 mL Intravenous PRN Taam-Akelman, Rosalea K, MD       And  . 0.9 %  sodium chloride infusion  250 mL Intravenous PRN Taam-Akelman, Rosalea K, MD      . acetaminophen (TYLENOL) tablet 650 mg  650 mg Oral Q4H PRN Rande Brunt, MD   650 mg at 04/20/20 0717  . benzocaine-Menthol (DERMOPLAST) 20-0.5 % topical spray 1 application  1 application Topical PRN Taam-Akelman, Rosalea K, MD      . bisacodyl (DULCOLAX) suppository 10 mg  10 mg Rectal Daily PRN Taam-Akelman, Rosalea K, MD      . coconut oil  1 application Topical PRN Taam-Akelman, Griselda Miner, MD      . witch hazel-glycerin (TUCKS) pad 1 application  1 application Topical PRN Taam-Akelman, Rosalea K, MD       And  . dibucaine (NUPERCAINAL) 1 % rectal ointment 1 application  1 application Rectal PRN Taam-Akelman, Rosalea K, MD      . diphenhydrAMINE (BENADRYL) capsule 25 mg  25 mg Oral Q6H PRN Taam-Akelman, Rosalea K, MD      . ibuprofen (ADVIL) tablet 600 mg  600 mg Oral Q6H Taam-Akelman, Rosalea K, MD   600 mg at 04/20/20 1801  . ondansetron (ZOFRAN) tablet 4 mg  4 mg Oral Q4H PRN Taam-Akelman, Rosalea K, MD       Or  . ondansetron (ZOFRAN) injection 4 mg  4 mg Intravenous Q4H PRN Taam-Akelman, Rosalea K, MD      . oxyCODONE (Oxy IR/ROXICODONE) immediate release tablet 10 mg  10 mg Oral Q4H PRN Taam-Akelman, Rosalea K, MD       . oxyCODONE (Oxy IR/ROXICODONE) immediate release tablet 5 mg  5 mg Oral Q4H PRN Rande Brunt, MD   5 mg at 04/20/20 1805  . oxytocin (PITOCIN) IV infusion 30 units in NS 500 mL - Premix  2.5 Units/hr Intravenous Continuous Rande Brunt, MD   Stopped at 04/19/20 1640  . prenatal  multivitamin tablet 1 tablet  1 tablet Oral Q1200 Taam-Akelman, Rosalea K, MD      . senna-docusate (Senokot-S) tablet 2 tablet  2 tablet Oral Q24H Rande Brunt, MD   2 tablet at 04/20/20 1240  . simethicone (MYLICON) chewable tablet 80 mg  80 mg Oral PRN Taam-Akelman, Rosalea K, MD      . sodium phosphate (FLEET) 7-19 GM/118ML enema 1 enema  1 enema Rectal Daily PRN Taam-Akelman, Rosalea K, MD      . Tdap (BOOSTRIX) injection 0.5 mL  0.5 mL Intramuscular Once Taam-Akelman, Griselda Miner, MD       Current Outpatient Medications  Medication Sig Dispense Refill  . ibuprofen (ADVIL) 600 MG tablet Take 1 tablet (600 mg total) by mouth every 6 (six) hours. 30 tablet 0    Musculoskeletal: Strength & Muscle Tone: within normal limits Gait & Station: normal Patient leans: N/A  Psychiatric Specialty Exam: Physical Exam Vitals and nursing note reviewed.  Constitutional:      Appearance: Normal appearance.  HENT:     Head: Normocephalic and atraumatic.     Nose: Nose normal.  Cardiovascular:     Rate and Rhythm: Normal rate and regular rhythm.  Musculoskeletal:        General: Normal range of motion.  Neurological:     Mental Status: She is alert and oriented to person, place, and time.     Review of Systems  Blood pressure 108/64, pulse (!) 54, temperature 98.6 F (37 C), temperature source Oral, resp. rate 18, height 4\' 9"  (1.448 m), weight 58.8 kg, SpO2 100 %, unknown if currently breastfeeding.Body mass index is 28.05 kg/m.  General Appearance: Casual  Eye Contact:  Good  Speech:  Normal Rate  Volume:  Normal  Mood:  Anxious  Affect:  Appropriate  Thought Process:   Descriptions of Associations: Intact  Orientation:  Full (Time, Place, and Person)  Thought Content:  Illogical, Obsessions and Rumination  Suicidal Thoughts:  No  Homicidal Thoughts:  No  Memory:  Immediate;   Good Recent;   Good Remote;   Good  Judgement:  Impaired  Insight:  Fair  Psychomotor Activity:  Normal  Concentration:  Concentration: Good and Attention Span: Good  Recall:  Good  Fund of Knowledge:  Good  Language:  Good  Akathisia:  No  Handed:  Right  AIMS (if indicated):     Assets:  Desire for Improvement Resilience Social Support  ADL's:  Intact  Cognition:  WNL  Sleep:      Assessment: 30 year old female with no past psychiatric history admitted to hospital for delivery and was found to be extremely agitated and threatening towards the healthcare workers. -On evaluation today patient denies any suicidal or homicidal ideations.  She denies any auditory or visual hallucinations.  She describes her episodes as agitated episodes where she loses her control and then feels extremely guilty about them immediately.  She had pressured speech at the time when she was discussing the topics which she did not like but overall was able to sit through the interview without getting aggressive or agitated.  While evaluation pediatrician informed patient that her newborn baby needs to stay in NICU as he is not gaining appropriate weight. Patient was extremely tearful and did not want to leave her son in NICU -It seems like patient might be having adjustment disorder versus DMDD versus intermittent explosive disorder.  Patient has had a preterm baby and she looks really concerned about the wellbeing of  her baby and it is difficult for her to be away from him.  She connects this to her loss of grandmother and father.  Her labile mood and tearfulness also supported although she does not show any intent to harm herself or anybody around her.  She also has history of anger outburst repeatedly and  mood swings from unknown time pointing toward intermittent explosive disorder but also looks like patient has the school feature of irritability with or without stressor. -Patient would benefit from outpatient mental health services.  Treatment Plan and Disposition:  --No evidence of imminent risk to self or others at present. --Not meet criteria for psychiatric inpatient admission. --She would benefit from outpatient mental health services, if she agrees to it.  Arnoldo Lenis, MD 04/21/2020 2:18 PM  PGY-1, Resident

## 2020-04-21 NOTE — Progress Notes (Signed)
Pt called to desk requesting to see a different nurse than the one just in her room.  When I entered room, patient was visibly agitated, and I asked her if I could help her.  She stated that she needs a new nurse because she's about to "beat that last nurse's ass."  I responded by letting her know assault on a health care worker is a felony.  She responded with "I don't care, it's assault on my baby how these people are treating him."  She stated she would "slap the shit out of that young white bitch.  She hasn't done shit for me or my baby."  She then looked at me and asked where her next nurse was.  I explained that I would assign her another nurse and send her in.  Once I got to the desk, the patient started repeatedly pressing the call bell & stating she needed to see her nurse now.  As soon as she hung up she would then immediately call again.  The last time she called and stated if she had to "get up out of her bed she was going to make a scene, and you don't want me to make a scene."  Her new bedside RN stepped into the room.  I then called the Administrative Coordinator & security, who then contacted [facility] Department to talk to the patient about communicating threats.  Verdon Cummins & myself stepped in to talk to the patient about the situation, she remained agitated, was raising her voice & cussing at [place].  The Police Officer stepped in the room to try and deescalate her.  She was informed she can leave the hospital at any time & that if she continued to communicate threats to the hospital workers she could potentially have charges pressed against her.  Per the [facility], we are to go into this patient's room in two's, no one is to enter her room alone.

## 2020-04-21 NOTE — Progress Notes (Signed)
Please see note by Burman Riis, RN.  To expound upon that note, an official report was filed with the [employer], Case # [id number].  Instructions were provided by [employer] on how to press charges should the need arise.  The attending was made aware of the patients behavior.  I instructed the staff to not enter the room alone; to always go in pairs. When I entered the unit, I could hear the patient yelling loud enough that she could be heard several rooms away.  I attempted to talk to the patient, with little success.  She was belligerent and rude.  I advised her that if she wasn't happy with her care, she could leave at any time.  I also advised her that I would press charges if she communicated threats. She responded with "you can't prove that I have communicated threats."  Hence, the staff not entering the room alone from here forward.

## 2020-04-21 NOTE — Discharge Summary (Signed)
Postpartum Discharge Summary    Patient Name: Sara Arroyo DOB: Oct 17, 1990 MRN: [id number]  Date of admission: 04/19/2020 Delivery date:04/19/2020  Delivering provider: Lyda Kalata K  Date of discharge: 04/21/2020  Admitting diagnosis: IUGR (intrauterine growth restriction) affecting care of mother [O36.5990] Intrauterine pregnancy: [redacted]w[redacted]d    Secondary diagnosis:  Active Problems:   IUGR (intrauterine growth restriction) affecting care of mother  Additional problems: psychiatric issues assessed while inpatient    Discharge diagnosis: Term Pregnancy Delivered                                              Post partum procedures:none Augmentation: AROM and Pitocin Complications: None  Hospital course: Induction of Labor With Vaginal Delivery   30y.o. yo [phone] 341w3das admitted to the hospital 04/19/2020 for induction of labor.  Indication for induction: IUGR.  Patient had an uncomplicated labor course as follows: Membrane Rupture Time/Date: 1:54 PM ,04/19/2020   Delivery Method:Vaginal, Spontaneous  Episiotomy: None  Lacerations:  None  Details of delivery can be found in separate delivery note.  Patient had a postpartum course significant for psychiatry involvement d/t patient making threats on staff and concern for psychatric issue.  [facility]. Patient is discharged 04/21/20. Newborn Data: Birth date:04/19/2020  Birth time:3:38 PM  Gender:Female  Living status:Living  Apgars:8 ,9  Weight:2580 g   Magnesium Sulfate received: No BMZ received: No Rhophylac:No MMR:No T-DaP:patient declined Flu: No Transfusion:No  Physical exam  Vitals:   04/19/20 2240 04/20/20 0323 04/20/20 1530 04/20/20 2100  BP: (!) 106/58 (!) 96/54 110/69 108/64  Pulse: 60 60 60 (!) 54  Resp: '20 18 20 18  ' Temp: 98.2 F (36.8 C) 98.2 F (36.8 C) 98.1 F (36.7 C) 98.6 F (37 C)  TempSrc: Oral Oral Oral Oral  SpO2: 99% 99%  100%  Weight:      Height:        General: uncooperative, would not answer questions, very agitated Lochia: appropriate Uterine Fundus: firm DVT Evaluation: unable to evaluated, patient refusal Labs: Lab Results  Component Value Date   WBC 12.2 (H) 04/20/2020   HGB 12.1 04/20/2020   HCT 34.2 (L) 04/20/2020   MCV 94.7 04/20/2020   PLT 135 (L) 04/20/2020   CMP Latest Ref Rng & Units 01/30/2020  Glucose 70 - 99 mg/dL 91  BUN 6 - 20 mg/dL 7  Creatinine 0.44 - 1.00 mg/dL 0.68  Sodium 135 - 145 mmol/L 137  Potassium 3.5 - 5.1 mmol/L 3.0(L)  Chloride 98 - 111 mmol/L 107  CO2 22 - 32 mmol/L 19(L)  Calcium 8.9 - 10.3 mg/dL 8.8(L)  Total Protein 6.5 - 8.1 g/dL 6.1(L)  Total Bilirubin 0.3 - 1.2 mg/dL 0.6  Alkaline Phos 38 - 126 U/L 72  AST 15 - 41 U/L 33  ALT 0 - 44 U/L 23   Edinburgh Score: Edinburgh Postnatal Depression Scale Screening Tool 09/05/2018  I have been able to laugh and see the funny side of things. 1  I have looked forward with enjoyment to things. 0  I have blamed myself unnecessarily when things went wrong. 0  I have been anxious or worried for no good reason. 1  I have felt scared or panicky for no good reason. 0  Things have been getting on top of me. 0  I have been so  unhappy that I have had difficulty sleeping. 0  I have felt sad or miserable. 1  I have been so unhappy that I have been crying. 1  The thought of harming myself has occurred to me. 0  Edinburgh Postnatal Depression Scale Total 4     After visit meds:  Allergies as of 04/21/2020   No Known Allergies     Medication List    STOP taking these medications   cyclobenzaprine 10 MG tablet Commonly known as: FLEXERIL     TAKE these medications   ibuprofen 600 MG tablet Commonly known as: ADVIL Take 1 tablet (600 mg total) by mouth every 6 (six) hours.        Discharge home in stable condition Infant Feeding: see peds notes Infant Disposition:NICU Discharge instruction: per After Visit Summary and Postpartum  booklet. Activity: Advance as tolerated. Pelvic rest for 6 weeks.  Diet: routine diet Future Appointments:No future appointments. Follow up Visit:  Follow-up Information    Taam-Akelman, Lawrence Santiago, MD Follow up in 4 week(s).   Specialty: Obstetrics and Gynecology Contact information: [place] [address] [phone]                Please schedule this patient for 2 postpartum visit in 4weeks with the following provider: MD. Additional Postpartum F/U:Postpartum Depression checkup  High risk pregnancy complicated by: IUGR Delivery mode:  Vaginal, Spontaneous  Anticipated Birth Control:  Unsure   04/21/2020 Allyn Kenner, DO

## 2020-04-21 NOTE — Progress Notes (Signed)
Patient's behavior has been appropriate post event.  Pt has indicated that infant has increased intake and she wanted infant weighed to see how much weight had been lost.  Informed pt that infant continuing to increase intake is encouraging and will hopefully get her one step closer to going home.  Pt verbalizes understanding

## 2020-04-21 NOTE — Social Work (Signed)
CSW was walking past [name] room and was informed by RN Cala Bradford L. that [name] was upset due to someone from the hospital contacting her mom Gae Gallop. CSW entered the room and observed infant in bassinet and [name] doctor also bedside. [name] was visibly upset as she spoke in a high tone about a staff member reportedly calling her mother. Charge Springer and [facility] entered the room at that time. [name] reported her mother called her and said she got a phone call from a doctor at the hospital asking for [name] address and background information. [name] expressed she feels as if everyone is trying to "set her up." [name] stated she was calm and doing fine before her mother started making contact. CSW attempted to console and deescalate [name]. [name] became tearful as she shared with staff that her mother may come visit and be there when things are happening with her, but "she does not know me that." Staff continued to try to calm [name] down as she retrieved infant from bassinet to attempt a feed. [name] reported that her mother said she would be coming to the room and she would make her tell her who called. [name] stated if her mother does not tell her she will make her leave the room. Staff clarified that [name] still wants her mom to visit. [name] confirmed her mother and sister Crist Infante can remain a visitor. Staff asked [name] if she would like to create a code word, in case her mother comes and she becomes upset while in the room. [name] reported her mother will leave if she tell her to and she will call the RN if there is an issue.  Update: CSW informed by charge nurse Victorino Dike, that [name] reported it was the psychiatry department that contacted her mother.   CSW will continue to assists and follow the recommendation of psychiatry once the consult has been completed.   Manfred Arch, MSW, [employer] Clinical Social Work [employer] and [employer] [phone]

## 2020-04-21 NOTE — Progress Notes (Signed)
I attempted to see the patient a few hours ago, at that time she said she didn't know how her bleeding was, she allowed me to assess uterine fundus, which was firm, and peripad had minimal blood. Add'l SW and RN came into room.  Patient was very agitated, yelling, stating her mother called and stated a doctor had called asking her questions about where the patient was living etc.  SW and RN staff tried to calm patient as I stood aside, but she could not be calmed.  After about 15 min I stepped out of the room to attend a delivery.  I spoke with psychiatry prior to seeing patient, they said there was no psych admission needed, but that she would f/u with psych services outpt and SW would coordinate this. Upon my return to the room, pt was holding baby.  I asked how she was feeling, and she said she didn't know and stared to the side.  She then began stating she wanted to go home,  I told her I had some questions about her physical health and if everything was fine I would discharge her.  She became agitated and stated she wouldn't go home without her baby.  I advised her she didn't have to go home, but would be discharged in stable condition from our service, and could remain in the hospital with the baby until the baby was stable for discharge.  At this point, she began repeating she wouldn't go home without her baby and I told her that was fine, it was her choice she she would like to stay with the baby 24/7 until discharge.  She said she didn't trust the nicu doctors and felt that they were going to do things to harm the baby and that she would take the baby home if she wanted to, and that no one could stop her. I tried to encourage pt to see the doctors and nurses as teammates with the same goal to ensure that the baby is safe and healthy.  She refused to ask further questions about her physical health but would just say she is having no problems.  I advised her to f/u in our with Dr. Claudine Mouton for postpartum care.   Pt further stated she shouldn't have been induced early and that her obstricians were the cause of the baby's problems currently.  At this point, I asked the patient if she had any questions for me, she replied no.  I advised her that I would go ahead and place her discharge orders.   Vitals:   04/19/20 2240 04/20/20 0323 04/20/20 1530 04/20/20 2100  BP: (!) 106/58 (!) 96/54 110/69 108/64  Pulse: 60 60 60 (!) 54  Resp: 20 18 20 18   Temp: 98.2 F (36.8 C) 98.2 F (36.8 C) 98.1 F (36.7 C) 98.6 F (37 C)  TempSrc: Oral Oral Oral Oral  SpO2: 99% 99%  100%  Weight:      Height:        Fundus firm No further exam allowed by patient  Lab Results  Component Value Date   WBC 12.2 (H) 04/20/2020   HGB 12.1 04/20/2020   HCT 34.2 (L) 04/20/2020   MCV 94.7 04/20/2020   PLT 135 (L) 04/20/2020    --/--/O POS (03/16 12-11-1983)  A/P Post partum day 2 Discharge, baby to go to NICU   3810

## 2020-04-21 NOTE — Social Work (Signed)
CSW contacted by charge nurse and informed infant requires a NICU admission. CSW informed [name] has concerns regarding the admission. CSW met with [name] to discuss the new information. [name] expressed her concerns about infant having to go to NICU. [name] expressed worries of how infant will be treated while admitted to NICU. CSW expressed understanding of [name] concerns and attempted to reassure her that infant will be well taken care of while NICU. CSW explained that [name] will have 24/7 visitation to stay with infant. [name] disclosed she has had family members that had to be admitted to the NICU and how it had an emotional impact. CSW empathized with [name] and offered to consult spiritual care if that would be comforting. [name] stated she has her whole church family and family at home praying for infant. [name] reported her OBGYN case worker called has been speaking with her about the NICU admission. CSW asked [name] for more information on her case worker. [name] stated she did not know she had one until she got a call from her one day asking if she had any needs. [name] was unable to provide the case workers name. CSW continued to support [name] as she expressed herself about the NICU admission. CSW informed [name] that if she were to discharge home with infant, it would be considered Against Medical Advice. [name] was understanding and stated she is not leaving with the infant.   CSW was informed that [name] was psychiatrically cleared and asked [name] if she would be in agreement to have a referral made for outpatient counseling services. [name] stated she was going to [facility] in [place] and provided permission for a referral to be made to them but no one else. [name] expressed no additional immediate needs at this time.   CSW contacted [facility] [phone] and completed a referral.   Jaonna Word, MSW, LCSWA Clinical Social Work  [facility] [phone] 

## 2020-04-24 ENCOUNTER — Telehealth: Payer: Self-pay

## 2020-04-24 LAB — SURGICAL PATHOLOGY

## 2020-04-24 NOTE — Telephone Encounter (Signed)
Transition Care Management Follow-up Telephone Call  Date of discharge and from where: 04/21/2020 from [facility]  How have you been since you were released from the hospital? Pt stated that she is doing well. Pt wanted to confirm if she had any medications to pick up from the pharmacy. I informed pt that the only medication was the ibuprofen and it is available over the counter.   Any questions or concerns? No  Items Reviewed:  Did the pt receive and understand the discharge instructions provided? Yes   Medications obtained and verified? Yes   Other? No   Any new allergies since your discharge? No   Dietary orders reviewed? n/a  Do you have support at home? Yes   .sw  Follow up appointments reviewed:   PCP Hospital f/u appt confirmed? No    Specialist Hospital f/u appt confirmed? No  Pt is calling OBGYN to schedule appointment.   Are transportation arrangements needed? No   If their condition worsens, is the pt aware to call PCP or go to the Emergency Dept.? Yes  Was the patient provided with contact information for the PCP's office or ED? Yes  Was to pt encouraged to call back with questions or concerns? Yes

## 2020-05-25 ENCOUNTER — Other Ambulatory Visit: Payer: Self-pay | Admitting: Obstetrics & Gynecology

## 2020-05-25 DIAGNOSIS — N6310 Unspecified lump in the right breast, unspecified quadrant: Secondary | ICD-10-CM

## 2020-05-31 DIAGNOSIS — Z3042 Encounter for surveillance of injectable contraceptive: Secondary | ICD-10-CM | POA: Diagnosis not present

## 2020-05-31 DIAGNOSIS — F319 Bipolar disorder, unspecified: Secondary | ICD-10-CM | POA: Diagnosis not present

## 2020-05-31 DIAGNOSIS — Z3202 Encounter for pregnancy test, result negative: Secondary | ICD-10-CM | POA: Diagnosis not present

## 2020-06-29 ENCOUNTER — Other Ambulatory Visit: Payer: Medicaid Other

## 2020-07-19 DIAGNOSIS — Z3202 Encounter for pregnancy test, result negative: Secondary | ICD-10-CM | POA: Diagnosis not present

## 2020-07-19 DIAGNOSIS — R87810 Cervical high risk human papillomavirus (HPV) DNA test positive: Secondary | ICD-10-CM | POA: Diagnosis not present

## 2020-08-09 ENCOUNTER — Other Ambulatory Visit: Payer: Medicaid Other

## 2020-08-17 ENCOUNTER — Ambulatory Visit
Admission: RE | Admit: 2020-08-17 | Discharge: 2020-08-17 | Disposition: A | Discharge status: Home / Self Care | Payer: Medicaid Other | Source: Ambulatory Visit | Attending: Obstetrics & Gynecology | Admitting: Obstetrics & Gynecology

## 2020-08-17 ENCOUNTER — Other Ambulatory Visit: Payer: Self-pay

## 2020-08-17 DIAGNOSIS — R922 Inconclusive mammogram: Secondary | ICD-10-CM | POA: Diagnosis not present

## 2020-08-17 DIAGNOSIS — Z803 Family history of malignant neoplasm of breast: Secondary | ICD-10-CM | POA: Diagnosis not present

## 2020-08-17 DIAGNOSIS — N6310 Unspecified lump in the right breast, unspecified quadrant: Secondary | ICD-10-CM

## 2020-08-24 DIAGNOSIS — Z3042 Encounter for surveillance of injectable contraceptive: Secondary | ICD-10-CM | POA: Diagnosis not present

## 2020-11-03 ENCOUNTER — Other Ambulatory Visit: Payer: Self-pay

## 2020-11-03 ENCOUNTER — Emergency Department (HOSPITAL_COMMUNITY)
Admission: EM | Admit: 2020-11-03 | Discharge: 2020-11-03 | Disposition: A | Discharge status: Home / Self Care | Payer: Medicaid Other | Attending: Emergency Medicine | Admitting: Emergency Medicine

## 2020-11-03 DIAGNOSIS — Z5321 Procedure and treatment not carried out due to patient leaving prior to being seen by health care provider: Secondary | ICD-10-CM | POA: Insufficient documentation

## 2020-11-03 DIAGNOSIS — R197 Diarrhea, unspecified: Secondary | ICD-10-CM | POA: Diagnosis not present

## 2020-11-03 DIAGNOSIS — Z20822 Contact with and (suspected) exposure to covid-19: Secondary | ICD-10-CM | POA: Diagnosis not present

## 2020-11-03 DIAGNOSIS — R112 Nausea with vomiting, unspecified: Secondary | ICD-10-CM | POA: Diagnosis not present

## 2020-11-03 DIAGNOSIS — M791 Myalgia, unspecified site: Secondary | ICD-10-CM | POA: Diagnosis not present

## 2020-11-03 LAB — URINALYSIS, ROUTINE W REFLEX MICROSCOPIC
Bilirubin Urine: NEGATIVE
Glucose, UA: NEGATIVE mg/dL
Hgb urine dipstick: NEGATIVE
Ketones, ur: NEGATIVE mg/dL
Leukocytes,Ua: NEGATIVE
Nitrite: NEGATIVE
Protein, ur: NEGATIVE mg/dL
Specific Gravity, Urine: 1.015 (ref 1.005–1.030)
pH: 7 (ref 5.0–8.0)

## 2020-11-03 LAB — COMPREHENSIVE METABOLIC PANEL
ALT: 14 U/L (ref 0–44)
AST: 18 U/L (ref 15–41)
Albumin: 3.7 g/dL (ref 3.5–5.0)
Alkaline Phosphatase: 53 U/L (ref 38–126)
Anion gap: 10 (ref 5–15)
BUN: 7 mg/dL (ref 6–20)
CO2: 24 mmol/L (ref 22–32)
Calcium: 9.2 mg/dL (ref 8.9–10.3)
Chloride: 105 mmol/L (ref 98–111)
Creatinine, Ser: 0.89 mg/dL (ref 0.44–1.00)
GFR, Estimated: >60 mL/min (ref >60)
Glucose, Bld: 123 mg/dL — ABNORMAL HIGH (ref 70–99)
Potassium: 3.6 mmol/L (ref 3.5–5.1)
Sodium: 139 mmol/L (ref 135–145)
Total Bilirubin: 0.5 mg/dL (ref 0.3–1.2)
Total Protein: 6.5 g/dL (ref 6.5–8.1)

## 2020-11-03 LAB — CBC
HCT: 42.3 % (ref 36.0–46.0)
Hemoglobin: 14.1 g/dL (ref 12.0–15.0)
MCH: 32.1 pg (ref 26.0–34.0)
MCHC: 33.3 g/dL (ref 30.0–36.0)
MCV: 96.4 fL (ref 80.0–100.0)
Platelets: 239 10*3/uL (ref 150–400)
RBC: 4.39 MIL/uL (ref 3.87–5.11)
RDW: 11.9 % (ref 11.5–15.5)
WBC: 9.3 10*3/uL (ref 4.0–10.5)
nRBC: 0 % (ref 0.0–0.2)

## 2020-11-03 LAB — I-STAT BETA HCG BLOOD, ED (MC, WL, AP ONLY): I-stat hCG, quantitative: <5 m[IU]/mL (ref <5)

## 2020-11-03 LAB — LIPASE, BLOOD: Lipase: 28 U/L (ref 11–51)

## 2020-11-03 NOTE — ED Notes (Signed)
Pt states that she is leaving and does not wish to be seen. Pt seen leaving ED entrance.

## 2020-11-03 NOTE — ED Triage Notes (Signed)
Pt here POV with multiple complaints. Symptoms started 11/01/20. Pt states she has all over sharp body pain. N/V/D. Pt states she is vomiting blood and diarrhea is bloody.

## 2020-11-06 ENCOUNTER — Telehealth: Payer: Self-pay

## 2020-11-06 NOTE — Telephone Encounter (Signed)
Transition Care Management Unsuccessful Follow-up Telephone Call  Date of discharge and from where:  11/03/2020-[place]   Attempts:  1st Attempt  Reason for unsuccessful TCM follow-up call:  Left voice message    

## 2020-11-07 NOTE — Telephone Encounter (Signed)
Transition Care Management Follow-up Telephone Call Date of discharge and from where: 11/03/2020 - [place] How have you been since you were released from the hospital? Pt left before being evaluated. Pt stated that she is feeling the same. LWBS after Triage. Pt went to [facility] and had her COVID test.  Any questions or concerns? No

## 2020-11-08 ENCOUNTER — Other Ambulatory Visit: Payer: Self-pay

## 2020-11-08 ENCOUNTER — Ambulatory Visit (HOSPITAL_COMMUNITY)
Admission: EM | Admit: 2020-11-08 | Discharge: 2020-11-08 | Disposition: A | Discharge status: Home / Self Care | Payer: Medicaid Other | Attending: Emergency Medicine | Admitting: Emergency Medicine

## 2020-11-08 ENCOUNTER — Encounter (HOSPITAL_COMMUNITY): Payer: Self-pay | Admitting: Emergency Medicine

## 2020-11-08 DIAGNOSIS — Z792 Long term (current) use of antibiotics: Secondary | ICD-10-CM | POA: Insufficient documentation

## 2020-11-08 DIAGNOSIS — H66003 Acute suppurative otitis media without spontaneous rupture of ear drum, bilateral: Secondary | ICD-10-CM | POA: Diagnosis not present

## 2020-11-08 DIAGNOSIS — Z20822 Contact with and (suspected) exposure to covid-19: Secondary | ICD-10-CM | POA: Insufficient documentation

## 2020-11-08 LAB — SARS CORONAVIRUS 2 (TAT 6-24 HRS): SARS Coronavirus 2: NEGATIVE

## 2020-11-08 MED ORDER — AMOXICILLIN 500 MG PO CAPS: 500.0000 mg | ORAL_CAPSULE | Freq: Three times a day (TID) | ORAL | 0 refills | Status: DC

## 2020-11-08 NOTE — Discharge Instructions (Addendum)
We will contact you if your COVID test is positive.  As your symptoms have been ongoing for the last week, you do not need to quarantine if your COVID test is positive.   Take the amoxicillin 1 pill twice a day for the next 7 days.   You can take Tylenol and/or Ibuprofen as needed for pain relief and fever reduction.   You can continue taking over-the-counter cold and congestion medication as needed for symptom relief.    Make sure you are drinking plenty of fluids, especially water, and rest.   Follow up with your primary care provider for re-evaluation.

## 2020-11-08 NOTE — ED Provider Notes (Signed)
[facility]    CSN: [id number] Arrival date & time: 11/08/20  0175      History   Chief Complaint Chief Complaint  Patient presents with   Otalgia    Bilateral    HPI Sara Arroyo is a 30 y.o. female.   Patient here for evaluation of bilateral ear pain and decreased hearing that has been ongoing for the past 5 days.  Reports having nasal congestion and cold-like symptoms for approximately 1 week.  Patient was initially going to be evaluated in the emergency room but did not stay due to wait times.  Reports getting a COVID test completed at a pharmacy but has been unable to obtain those results.  Reports taking ibuprofen and OTC cold medication with minimal symptom relief.  Denies any trauma, injury, or other precipitating event.  Denies any specific alleviating or aggravating factors.  Denies any chest pain, shortness of breath, N/V/D, numbness, tingling, weakness, abdominal pain, or headaches.    The history is provided by the patient.  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 04/19/2020   History of shoulder dystocia in prior pregnancy 09/11/2018   Labor without complication 09/04/2018   Concern about STD in female without diagnosis 11/04/2014   Encounter for Depo-Provera contraception 09/03/2012   DUB (dysfunctional uterine bleeding) 09/03/2012   Weight loss, non-intentional 08/30/2011    Past Surgical History:  Procedure Laterality Date   COLPOSCOPY  10/2011   NO PAST SURGERIES      OB History     Gravida  4   Para  3   Term  3   Preterm      AB  1   Living  3      SAB      IAB  1   Ectopic      Multiple  0   Live Births  2             Home Medications    Prior to Admission medications   Medication Sig Start Date End Date Taking? Authorizing Provider  amoxicillin (AMOXIL) 500 MG capsule Take 1 capsule (500 mg total) by mouth 3 (three) times daily. 11/08/20  Yes Ivette Loyal, NP  ibuprofen (ADVIL) 600 MG tablet Take 1 tablet (600 mg total) by mouth every 6 (six) hours. 04/21/20   Philip Aspen, DO    Family History Family History  Problem Relation Age of Onset   Healthy Mother    Cancer Father    Cancer Maternal Grandmother    Breast cancer Maternal Grandmother    Breast cancer Paternal Grandmother    Breast cancer Maternal Great-grandmother    Breast cancer Paternal Great-grandmother    Breast cancer Paternal Aunt     Social History Social History   Tobacco Use   Smoking status: Never   Smokeless tobacco: Never  Vaping Use   Vaping Use: Never used  Substance Use Topics   Alcohol use: No   Drug use: Not Currently    Types: Marijuana     Allergies   Patient has no known allergies.   Review of Systems Review of Systems  HENT:  Positive  for congestion and ear pain.   All other systems reviewed and are negative.   Physical Exam Triage Vital Signs ED Triage Vitals  Enc Vitals Group     BP 11/08/20 0850 94/61     Pulse Rate 11/08/20 0850 69     Resp 11/08/20 0850 16     Temp 11/08/20 0850 97.9 F (36.6 C)     Temp Source 11/08/20 0850 Oral     SpO2 11/08/20 0850 97 %     Weight --      Height --      Head Circumference --      Peak Flow --      Pain Score 11/08/20 0846 8     Pain Loc --      Pain Edu? --      Excl. in GC? --    No data found.  Updated Vital Signs BP 94/61 (BP Location: Right Arm)   Pulse 69   Temp 97.9 F (36.6 C) (Oral)   Resp 16   SpO2 97%   Visual Acuity Right Eye Distance:   Left Eye Distance:   Bilateral Distance:    Right Eye Near:   Left Eye Near:    Bilateral Near:     Physical Exam Vitals and nursing note reviewed.   Constitutional:      General: She is not in acute distress.    Appearance: Normal appearance. She is not ill-appearing, toxic-appearing or diaphoretic.  HENT:     Head: Normocephalic and atraumatic.     Right Ear: Decreased hearing noted. Tenderness present. Tympanic membrane is injected, erythematous and bulging.     Left Ear: Decreased hearing noted. Tenderness present. Tympanic membrane is injected, erythematous and bulging.     Nose: No congestion.     Mouth/Throat:     Pharynx: No posterior oropharyngeal erythema.  Eyes:     Conjunctiva/sclera: Conjunctivae normal.  Cardiovascular:     Rate and Rhythm: Normal rate.     Pulses: Normal pulses.  Pulmonary:     Effort: Pulmonary effort is normal.  Abdominal:     General: Abdomen is flat.  Musculoskeletal:        General: Normal range of motion.     Cervical back: Normal range of motion.  Skin:    General: Skin is warm and dry.  Neurological:     General: No focal deficit present.     Mental Status: She is alert and oriented to person, place, and time.  Psychiatric:        Mood and Affect: Mood normal.     UC Treatments / Results  Labs (all labs ordered are listed, but only abnormal results are displayed) Labs Reviewed  SARS CORONAVIRUS 2 (TAT 6-24 HRS)    EKG   Radiology No results found.  Procedures Procedures (including critical care time)  Medications Ordered in UC Medications - No data to display  Initial Impression / Assessment and Plan / UC Course  I have reviewed the triage vital signs and the nursing notes.  Pertinent labs & imaging results that were available during my care of the patient were reviewed by me and considered in my medical decision making (see chart for details).    Assessment negative for red flags or concerns.  Bilateral otitis media.  Patient requesting COVID test so COVID test pending.  As symptoms have been ongoing for 5 to 7 days patient will not need to quarantine if it is  positive.  We  will treat otitis media with amoxicillin 1 pill twice a day for the next 7 days.  Tylenol and or ibuprofen as needed.  May continue OTC medication for symptom relief.  Follow-up with primary care provider for reevaluation soon as possible Final Clinical Impressions(s) / UC Diagnoses   Final diagnoses:  Non-recurrent acute suppurative otitis media of both ears without spontaneous rupture of tympanic membranes     Discharge Instructions      We will contact you if your COVID test is positive.  As your symptoms have been ongoing for the last week, you do not need to quarantine if your COVID test is positive.   Take the amoxicillin 1 pill twice a day for the next 7 days.   You can take Tylenol and/or Ibuprofen as needed for pain relief and fever reduction.   You can continue taking over-the-counter cold and congestion medication as needed for symptom relief.    Make sure you are drinking plenty of fluids, especially water, and rest.   Follow up with your primary care provider for re-evaluation.      ED Prescriptions     Medication Sig Dispense Auth. Provider   amoxicillin (AMOXIL) 500 MG capsule Take 1 capsule (500 mg total) by mouth 3 (three) times daily. 21 capsule Ivette Loyal, NP      PDMP not reviewed this encounter.   Ivette Loyal, NP 11/08/20 [phone]

## 2020-11-08 NOTE — ED Triage Notes (Signed)
Pt presents with bilateral ear pain and decrease in hearing xs 5 days.

## 2020-11-16 ENCOUNTER — Other Ambulatory Visit: Payer: Self-pay

## 2020-11-16 ENCOUNTER — Ambulatory Visit (HOSPITAL_COMMUNITY)
Admission: EM | Admit: 2020-11-16 | Discharge: 2020-11-16 | Disposition: A | Discharge status: Home / Self Care | Payer: Medicaid Other | Attending: Emergency Medicine | Admitting: Emergency Medicine

## 2020-11-16 ENCOUNTER — Encounter (HOSPITAL_COMMUNITY): Payer: Self-pay | Admitting: Emergency Medicine

## 2020-11-16 DIAGNOSIS — H9193 Unspecified hearing loss, bilateral: Secondary | ICD-10-CM | POA: Diagnosis not present

## 2020-11-16 DIAGNOSIS — J0100 Acute maxillary sinusitis, unspecified: Secondary | ICD-10-CM

## 2020-11-16 DIAGNOSIS — Z3042 Encounter for surveillance of injectable contraceptive: Secondary | ICD-10-CM | POA: Diagnosis not present

## 2020-11-16 MED ORDER — DOXYCYCLINE HYCLATE 100 MG PO CAPS: 100.0000 mg | ORAL_CAPSULE | Freq: Two times a day (BID) | ORAL | 0 refills | Status: AC

## 2020-11-16 MED ORDER — FLUTICASONE PROPIONATE 50 MCG/ACT NA SUSP: 2.0000 | Freq: Every day | NASAL | 0 refills | Status: DC

## 2020-11-16 NOTE — ED Provider Notes (Signed)
HPI  SUBJECTIVE:  Sara Arroyo is a 30 y.o. female who presents with decreased hearing for the past 2 weeks that is getting worse.  Patient states that she is hearing "echoes" and that sounds are "muffled".  She states it started on the left and is now bilateral.  States that she cannot hear at all from her left ear.  She was seen here on 10/5 for the same, found to have a bilateral otitis media and was started on amoxicillin.  She is on day number 6 out of 7.  She denies ear pain, exposure to noise, foreign body insertion, trauma, vertigo.  She reports continued green rhinorrhea, nasal congestion, cough.  She states that she can hear "small things" but cannot hear voices.  She has also tried showers, peppermints, ibuprofen 800 mg without improvement in symptoms.  There are no aggravating or alleviating factors.  She has no past medical history.  LMP: She is on Depo.  Denies the possibility being pregnant.  PMD: OB/GYN Nestor Ramp.   Past Medical History:  Diagnosis Date   Abnormal Pap smear    Hypokalemia    Trichomonas infection     Past Surgical History:  Procedure Laterality Date   COLPOSCOPY  10/2011   NO PAST SURGERIES      Family History  Problem Relation Age of Onset   Healthy Mother    Cancer Father    Cancer Maternal Grandmother    Breast cancer Maternal Grandmother    Breast cancer Paternal Grandmother    Breast cancer Maternal Great-grandmother    Breast cancer Paternal Great-grandmother    Breast cancer Paternal Aunt     Social History   Tobacco Use   Smoking status: Never   Smokeless tobacco: Never  Vaping Use   Vaping Use: Never used  Substance Use Topics   Alcohol use: No   Drug use: Not Currently    Types: Marijuana    No current facility-administered medications for this encounter.  Current Outpatient Medications:    doxycycline (VIBRAMYCIN) 100 MG capsule, Take 1 capsule (100 mg total) by mouth 2 (two) times daily for 10 days., Disp: 20  capsule, Rfl: 0   fluticasone (FLONASE) 50 MCG/ACT nasal spray, Place 2 sprays into both nostrils daily., Disp: 16 g, Rfl: 0   ibuprofen (ADVIL) 600 MG tablet, Take 1 tablet (600 mg total) by mouth every 6 (six) hours. (Patient not taking: Reported on 11/16/2020), Disp: 30 tablet, Rfl: 0  No Known Allergies   ROS  As noted in HPI.   Physical Exam  BP 100/63 (BP Location: Right Arm)   Pulse 70   Temp 98.6 F (37 C) (Oral)   Resp 18   SpO2 97%   Constitutional: Well developed, well nourished, no acute distress Eyes:  EOMI, conjunctiva normal bilaterally HENT: Normocephalic, atraumatic,mucus membranes moist.  TMs normal bilaterally.  Decreased hearing right ear.  Absent hearing left ear.  Mild nasal congestion.  Positive left maxillary sinus tenderness.  No other sinus tenderness. normal turbinates. Respiratory: Normal inspiratory effort Cardiovascular: Normal rate GI: nondistended skin: No rash, skin intact Musculoskeletal: no deformities Neurologic: Alert & oriented x 3, no focal neuro deficits Psychiatric: Speech and behavior appropriate   ED Course   Medications - No data to display  Orders Placed This Encounter  Procedures   Ambulatory referral to ENT    Referral Priority:   Routine    Referral Type:   Consultation    Referral Reason:   Specialty  Services Required    Requested Specialty:   Otolaryngology    Number of Visits Requested:   1    No results found for this or any previous visit (from the past 24 hour(s)). No results found.  ED Clinical Impression  1. Bilateral hearing loss, unspecified hearing loss type   2. Acute non-recurrent maxillary sinusitis      ED Assessment/Plan  Previous records reviewed.  As noted in HPI.  It seems that the amoxicillin has resolved the otitis media, but I am concerned for residual hearing damage.  Referring to Dr. Ezzard Standing  for further evaluation.  Or may follow-up with [facility] ENT. she also does have some  left-sided sinus tenderness that she is reporting continued nasal congestion green rhinorrhea, and cough.  Will have her discontinue the amoxicillin today and send home with doxycycline 100 mg p.o. twice daily for 7 days, Flonase, advised saline nasal irrigation.   Discussed  MDM, treatment plan, and plan for follow-up with patient.  patient agrees with plan.   Meds ordered this encounter  Medications   doxycycline (VIBRAMYCIN) 100 MG capsule    Sig: Take 1 capsule (100 mg total) by mouth 2 (two) times daily for 10 days.    Dispense:  20 capsule    Refill:  0   fluticasone (FLONASE) 50 MCG/ACT nasal spray    Sig: Place 2 sprays into both nostrils daily.    Dispense:  16 g    Refill:  0      *This clinic note was created using Scientist, clinical (histocompatibility and immunogenetics). Therefore, there may be occasional mistakes despite careful proofreading.  ?    Domenick Gong, MD 11/17/20 [phone]

## 2020-11-16 NOTE — ED Triage Notes (Signed)
Seen 11/08/2020.  No pain.  Continues with cough and runny nose.  Has productive cough.  Her main reason for being at ucc today and cannot hear.  Hears an echo.  Hearing is no better.  Patient is taking antibiotic .  Only has pain when chewing, lying down.  Initially it was only left ear after taking medication, both ears have decreased hearing

## 2020-11-16 NOTE — Discharge Instructions (Addendum)
I have put in a referral to Dr. Ezzard Standing, ear nose throat and hearing specialist on-call.  Somebody should contact you to arrange an appointment.  However, you can also call his office or [facility]'s office to arrange your own appointment.  Please follow-up as soon as you can.  I am also concerned that you have a residual sinus infection, so I am prescribing doxycycline to completely get rid of this.  I would stop the amoxicillin for now. take the doxycycline, Flonase, saline nasal irrigation with a Lloyd Huger Med rinse and distilled water as often as you want.

## 2021-02-06 DIAGNOSIS — H5213 Myopia, bilateral: Secondary | ICD-10-CM | POA: Diagnosis not present

## 2021-02-08 DIAGNOSIS — Z3042 Encounter for surveillance of injectable contraceptive: Secondary | ICD-10-CM | POA: Diagnosis not present

## 2021-05-07 DIAGNOSIS — Z3042 Encounter for surveillance of injectable contraceptive: Secondary | ICD-10-CM | POA: Diagnosis not present

## 2021-07-31 DIAGNOSIS — Z3042 Encounter for surveillance of injectable contraceptive: Secondary | ICD-10-CM | POA: Diagnosis not present

## 2021-10-26 DIAGNOSIS — Z3042 Encounter for surveillance of injectable contraceptive: Secondary | ICD-10-CM | POA: Diagnosis not present

## 2021-11-13 ENCOUNTER — Emergency Department (HOSPITAL_COMMUNITY): Payer: Medicaid Other

## 2021-11-13 ENCOUNTER — Emergency Department (HOSPITAL_COMMUNITY)
Admission: EM | Admit: 2021-11-13 | Discharge: 2021-11-13 | Disposition: A | Discharge status: Home / Self Care | Payer: Medicaid Other | Attending: Emergency Medicine | Admitting: Emergency Medicine

## 2021-11-13 ENCOUNTER — Encounter (HOSPITAL_COMMUNITY): Payer: Self-pay

## 2021-11-13 DIAGNOSIS — J439 Emphysema, unspecified: Secondary | ICD-10-CM | POA: Diagnosis not present

## 2021-11-13 DIAGNOSIS — R0789 Other chest pain: Secondary | ICD-10-CM | POA: Diagnosis not present

## 2021-11-13 DIAGNOSIS — Y92410 Unspecified street and highway as the place of occurrence of the external cause: Secondary | ICD-10-CM | POA: Diagnosis not present

## 2021-11-13 DIAGNOSIS — R079 Chest pain, unspecified: Secondary | ICD-10-CM | POA: Diagnosis not present

## 2021-11-13 DIAGNOSIS — S199XXA Unspecified injury of neck, initial encounter: Secondary | ICD-10-CM | POA: Diagnosis not present

## 2021-11-13 DIAGNOSIS — M25512 Pain in left shoulder: Secondary | ICD-10-CM

## 2021-11-13 DIAGNOSIS — R519 Headache, unspecified: Secondary | ICD-10-CM | POA: Diagnosis not present

## 2021-11-13 DIAGNOSIS — S0990XA Unspecified injury of head, initial encounter: Secondary | ICD-10-CM | POA: Diagnosis not present

## 2021-11-13 DIAGNOSIS — M542 Cervicalgia: Secondary | ICD-10-CM | POA: Diagnosis not present

## 2021-11-13 MED ORDER — ACETAMINOPHEN 500 MG PO TABS
1000.0000 mg | ORAL_TABLET | Freq: Once | ORAL | Status: AC
  Administered 2021-11-13: 1000 mg via ORAL
  Filled 2021-11-13: qty 2

## 2021-11-13 MED ORDER — ACETAMINOPHEN 325 MG PO TABS: 650.0000 mg | ORAL_TABLET | Freq: Four times a day (QID) | ORAL | 0 refills | Status: AC | PRN

## 2021-11-13 MED ORDER — CYCLOBENZAPRINE HCL 10 MG PO TABS: 10.0000 mg | ORAL_TABLET | Freq: Two times a day (BID) | ORAL | 0 refills | Status: DC | PRN

## 2021-11-13 MED ORDER — IBUPROFEN 200 MG PO TABS
600.0000 mg | ORAL_TABLET | Freq: Once | ORAL | Status: AC
  Administered 2021-11-13: 600 mg via ORAL
  Filled 2021-11-13: qty 3

## 2021-11-13 MED ORDER — IBUPROFEN 600 MG PO TABS: 600.0000 mg | ORAL_TABLET | Freq: Four times a day (QID) | ORAL | 0 refills | Status: AC | PRN
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
## 2022-01-25 DIAGNOSIS — Z3042 Encounter for surveillance of injectable contraceptive: Secondary | ICD-10-CM | POA: Diagnosis not present

## 2022-02-12 DIAGNOSIS — Z0000 Encounter for general adult medical examination without abnormal findings: Secondary | ICD-10-CM | POA: Diagnosis not present

## 2022-02-12 DIAGNOSIS — R87610 Atypical squamous cells of undetermined significance on cytologic smear of cervix (ASC-US): Secondary | ICD-10-CM | POA: Diagnosis not present

## 2022-02-12 DIAGNOSIS — Z124 Encounter for screening for malignant neoplasm of cervix: Secondary | ICD-10-CM | POA: Diagnosis not present

## 2022-02-12 DIAGNOSIS — R875 Abnormal microbiological findings in specimens from female genital organs: Secondary | ICD-10-CM | POA: Diagnosis not present

## 2022-02-20 ENCOUNTER — Emergency Department (HOSPITAL_COMMUNITY): Payer: Medicaid Other

## 2022-02-20 ENCOUNTER — Encounter (HOSPITAL_COMMUNITY): Payer: Self-pay | Admitting: *Deleted

## 2022-02-20 ENCOUNTER — Other Ambulatory Visit: Payer: Self-pay

## 2022-02-20 ENCOUNTER — Emergency Department (HOSPITAL_COMMUNITY)
Admission: EM | Admit: 2022-02-20 | Discharge: 2022-02-20 | Disposition: A | Discharge status: Home / Self Care | Payer: Medicaid Other | Attending: Emergency Medicine | Admitting: Emergency Medicine

## 2022-02-20 DIAGNOSIS — M79671 Pain in right foot: Secondary | ICD-10-CM | POA: Insufficient documentation

## 2022-02-20 MED ORDER — PREDNISONE 10 MG (21) PO TBPK: ORAL_TABLET | Freq: Every day | ORAL | 0 refills | Status: DC

## 2022-02-20 NOTE — Discharge Instructions (Addendum)
You are seen today in the emergency department for foot pain.  Your x-ray was very reassuring without any abnormalities to your bones, no calcium deposits.  I suspect you have some inflammation, this should resolve with time.  Ice, continue taking Tylenol and Motrin.  I sent a prednisone pack to your pharmacy which you should take as prescribed to help reduce inflammation.  Return to the ED if you start having fevers, significant swelling or inability to walk secondary to pain.  If the pain persist greater than a week follow-up with your primary.

## 2022-02-20 NOTE — ED Provider Notes (Signed)
[facility] DEPT Provider Note   CSN: [id number] Arrival date & time: 02/20/22  1129     History  Chief Complaint  Patient presents with   Foot Pain    Sara Arroyo is a 32 y.o. female.   Foot Pain     Patient presents due to right foot pain.  States it started about 4 or 5 days ago, it was atraumatic.  She feels it mostly to the large toe side of her foot on the ventral side and it radiates to the dorsal side of her foot up to her ankle.  It is worse with movement, also worse at night after using it throughout the day.  She states warm soaks helped for about 5 minutes and the pain returns.  She is taken ibuprofen with no change in symptoms.  No fevers, medication changes, history of diabetes or gout.  She works at [employer] and is on her feet throughout the day.  Describes the pain as "jumping".  Home Medications Prior to Admission medications   Medication Sig Start Date End Date Taking? Authorizing Provider  predniSONE (STERAPRED UNI-PAK 21 TAB) 10 MG (21) TBPK tablet Take by mouth daily. Take 6 tabs by mouth daily  for 2 days, then 5 tabs for 2 days, then 4 tabs for 2 days, then 3 tabs for 2 days, 2 tabs for 2 days, then 1 tab by mouth daily for 2 days 02/20/22  Yes Sherrill Raring, PA-C  acetaminophen (TYLENOL) 325 MG tablet Take 2 tablets (650 mg total) by mouth every 6 (six) hours as needed for up to 30 doses for moderate pain or mild pain. 11/13/21   Wyvonnia Dusky, MD  cyclobenzaprine (FLEXERIL) 10 MG tablet Take 1 tablet (10 mg total) by mouth 2 (two) times daily as needed for up to 20 doses for muscle spasms. 11/13/21   Wyvonnia Dusky, MD  fluticasone (FLONASE) 50 MCG/ACT nasal spray Place 2 sprays into both nostrils daily. 11/16/20   Melynda Ripple, MD  ibuprofen (ADVIL) 600 MG tablet Take 1 tablet (600 mg total) by mouth every 6 (six) hours. Patient not taking: Reported on 11/16/2020 04/21/20   Allyn Kenner, DO       Allergies    Patient has no known allergies.    Review of Systems   Review of Systems  Physical Exam Updated Vital Signs BP 111/70 (BP Location: Right Arm)   Pulse 79   Temp 98.9 F (37.2 C) (Oral)   Resp 16   Ht 4\' 8"  (1.422 m)   Wt 50.3 kg   SpO2 97%   BMI 24.89 kg/m  Physical Exam Vitals and nursing note reviewed. Exam conducted with a chaperone present.  Constitutional:      General: She is not in acute distress.    Appearance: Normal appearance.  HENT:     Head: Normocephalic and atraumatic.  Eyes:     General: No scleral icterus.    Extraocular Movements: Extraocular movements intact.     Pupils: Pupils are equal, round, and reactive to light.  Cardiovascular:     Pulses: Normal pulses.  Musculoskeletal:        General: Tenderness present. Normal range of motion.     Comments: Tender to palpation over right toe and metatarsal.  Is able to flex and extend this elicits pain.  No obvious swelling or erythema, able to flex and extend at the ankle without any difficulties.  Skin:    Capillary Refill:  Capillary refill takes less than 2 seconds.     Coloration: Skin is not jaundiced.     Findings: No erythema.  Neurological:     Mental Status: She is alert. Mental status is at baseline.     Coordination: Coordination normal.     ED Results / Procedures / Treatments   Labs (all labs ordered are listed, but only abnormal results are displayed) Labs Reviewed - No data to display  EKG None  Radiology DG Foot Complete Right  Result Date: 02/20/2022 CLINICAL DATA:  Pain without trauma EXAM: RIGHT FOOT COMPLETE - 3+ VIEW COMPARISON:  None Available. FINDINGS: No acute fracture or dislocation. No periosteal reaction or callus deposition. IMPRESSION: No acute osseous abnormality. Electronically Signed   By: Abigail Miyamoto M.D.   On: 02/20/2022 13:21    Procedures Procedures    Medications Ordered in ED Medications - No data to display  ED Course/ Medical  Decision Making/ A&P                             Medical Decision Making Amount and/or Complexity of Data Reviewed Radiology: ordered.  Risk Prescription drug management.   Patient presents due to right foot pain.  Differential includes not limited to septic joint, gout, occult fracture, ischemic limb, radiculopathy, cellulitis, myalgia  On exam patient is neurovascular intact with brisk cap refill and DP and PT are 2+.  Afebrile, no SIRS criteria tolerates passive ROM.  Not consistent with underlying ischemic limb.  Additionally presentation is not consistent with gout, there is no podagra.  Doubt cellulitis given no overlying skin discoloration.  I ordered plain film which is negative for any acute process.  I agree with radiologist.  Workup is very reassuring overall, do not suspect any emergent etiology for the foot pain.  Encouraged ice, RICE, anti-inflammatories.  Stable for discharge at this time.  Return precaution discussed with patient verbalized understanding agreement with the plan, suspect musculoskeletal process nonemergent.        Final Clinical Impression(s) / ED Diagnoses Final diagnoses:  Foot pain, right    Rx / DC Orders ED Discharge Orders          Ordered    predniSONE (STERAPRED UNI-PAK 21 TAB) 10 MG (21) TBPK tablet  Daily        02/20/22 1329              Sherrill Raring, PA-C 02/20/22 1639    Dorie Rank, MD 02/21/22 [phone]

## 2022-02-20 NOTE — ED Triage Notes (Signed)
Patient states she woke up Saturday morning with pain in her right foot.  She states she does not know what happened.  She reports throbbing pain.  She has tried soaking the foot.  She last took ibuprofen for pain on yesterday,

## 2022-02-22 ENCOUNTER — Telehealth: Payer: Self-pay | Admitting: *Deleted

## 2022-02-22 NOTE — Patient Outreach (Signed)
  Care Coordination [facility] Note Transition Care Management Unsuccessful Follow-up Telephone Call  Date of discharge and from where:  02/20/22 from [facility]  Attempts:  1st Attempt  Reason for unsuccessful TCM follow-up call:  Left voice message   Lurena Joiner RN, [place] RN Care Coordinator

## 2022-04-23 DIAGNOSIS — Z3040 Encounter for surveillance of contraceptives, unspecified: Secondary | ICD-10-CM | POA: Diagnosis not present

## 2022-07-17 DIAGNOSIS — Z3042 Encounter for surveillance of injectable contraceptive: Secondary | ICD-10-CM | POA: Diagnosis not present

## 2022-10-11 DIAGNOSIS — Z3042 Encounter for surveillance of injectable contraceptive: Secondary | ICD-10-CM | POA: Diagnosis not present

## 2022-10-29 ENCOUNTER — Ambulatory Visit (HOSPITAL_COMMUNITY)
Admission: EM | Admit: 2022-10-29 | Discharge: 2022-10-29 | Disposition: A | Discharge status: Home / Self Care | Payer: Medicaid Other

## 2022-10-29 ENCOUNTER — Telehealth: Payer: Self-pay

## 2022-10-29 ENCOUNTER — Encounter (HOSPITAL_COMMUNITY): Payer: Self-pay

## 2022-10-29 DIAGNOSIS — R0789 Other chest pain: Secondary | ICD-10-CM

## 2022-10-29 MED ORDER — TIZANIDINE HCL 4 MG PO TABS: 4.0000 mg | ORAL_TABLET | Freq: Three times a day (TID) | ORAL | 0 refills | Status: AC | PRN

## 2022-10-29 NOTE — Telephone Encounter (Signed)
Medicaid Managed Care   Unsuccessful Outreach Note  10/29/2022 Name: Sara Arroyo MRN: [id number] DOB: [birth year]  Referred by: Patient, No Pcp Per Reason for referral : No chief complaint on file.   An unsuccessful telephone outreach was attempted today. The patient was referred to the case management team for assistance with care management and care coordination.   Follow Up Plan: If patient returns call to provider office, please advise to call Embedded Care Management Care Guide Nicholes Rough* at [phone]*  Nicholes Rough, CMA Care Guide [employer]

## 2022-10-29 NOTE — ED Provider Notes (Signed)
[facility]    CSN: [id number] Arrival date & time: 10/29/22  1004      History   Chief Complaint Chief Complaint  Patient presents with   Chest Pain   Knee Pain    HPI Sara Arroyo is a 32 y.o. female.   Patient presents today with 2-3 day history of right sided chest pain.   CHEST PAIN Time since onset: Duration: Onset: {Blank single:19197::"sudden","gradual"} Quality:  Severity: Location: {Blank single:19197::"substernal","left para substernal", "right para substernal", "subxiphoid", "upper right", "upper left", "upper sternum"} Radiation: {Blank single:19197::"none","neck", "jaw", "left arm", "right arm", "back"} Episode duration:  Frequency: Related to exertion: {Blank single:19197::"yes","no"} Activity when pain started:  Trauma: {Blank single:19197::"yes","no"} Anxiety/recent stressors: {Blank single:19197::"yes","no"} Aggravating factors:  Alleviating factors:  Status: Treatments attempted: Current pain status: Shortness of breath: {Blank single:19197::"yes","no"} Cough: {Blank single:19197::"yes","no", "yes, productive", "yes, non-productive"} Nausea: {Blank single:19197::"yes","no"} Diaphoresis: {Blank single:19197::"yes","no"} Heartburn: {Blank single:19197::"yes","no"} Palpitations: {Blank single:19197::"yes","no"}      Past Medical History:  Diagnosis Date   Abnormal Pap smear    Hypokalemia    Trichomonas infection     Patient Active Problem List   Diagnosis Date Noted   Adjustment disorder with anxiety    Intermittent explosive disorder in adult    IUGR (intrauterine growth restriction) affecting care of mother 04/19/2020   History of shoulder dystocia in prior pregnancy 09/11/2018   Labor without complication 09/04/2018   Concern about STD in female without diagnosis 11/04/2014   Encounter for Depo-Provera contraception 09/03/2012   DUB (dysfunctional uterine bleeding) 09/03/2012   Weight loss, non-intentional  08/30/2011    Past Surgical History:  Procedure Laterality Date   COLPOSCOPY  10/2011   NO PAST SURGERIES      OB History     Gravida  4   Para  3   Term  3   Preterm      AB  1   Living  3      SAB      IAB  1   Ectopic      Multiple  0   Live Births  2            Home Medications    Prior to Admission medications   Medication Sig Start Date End Date Taking? Authorizing Provider  DEPO-PROVERA 150 MG/ML SUSY Inject 1 mL into the muscle every 3 (three) months. 10/10/22  Yes [provider]  tiZANidine (ZANAFLEX) 4 MG tablet Take 1 tablet (4 mg total) by mouth every 8 (eight) hours as needed for muscle spasms. Do not take with alcohol or while driving or operating heavy machinery.  May cause drowsiness. 10/29/22  Yes Valentino Nose, NP  acetaminophen (TYLENOL) 325 MG tablet Take 2 tablets (650 mg total) by mouth every 6 (six) hours as needed for up to 30 doses for moderate pain or mild pain. 11/13/21   Terald Sleeper, MD  ibuprofen (ADVIL) 600 MG tablet Take 1 tablet (600 mg total) by mouth every 6 (six) hours. Patient not taking: Reported on 11/16/2020 04/21/20   Philip Aspen, DO    Family History Family History  Problem Relation Age of Onset   Healthy Mother    Cancer Father    Cancer Maternal Grandmother    Breast cancer Maternal Grandmother    Breast cancer Paternal Grandmother    Breast cancer Maternal Great-grandmother    Breast cancer Paternal Great-grandmother    Breast cancer Paternal Aunt     Social History Social History  Tobacco Use   Smoking status: Never   Smokeless tobacco: Never  Vaping Use   Vaping status: Never Used  Substance Use Topics   Alcohol use: No   Drug use: Not Currently    Types: Marijuana     Allergies   Patient has no known allergies.   Review of Systems Review of Systems   Physical Exam Triage Vital Signs ED Triage Vitals  Encounter Vitals Group     BP 10/29/22 1138 (!) 106/59      Systolic BP Percentile --      Diastolic BP Percentile --      Pulse Rate 10/29/22 1138 70     Resp 10/29/22 1138 16     Temp 10/29/22 1138 99 F (37.2 C)     Temp Source 10/29/22 1138 Oral     SpO2 10/29/22 1138 98 %     Weight 10/29/22 1138 120 lb (54.4 kg)     Height --      Head Circumference --      Peak Flow --      Pain Score 10/29/22 1137 10     Pain Loc --      Pain Education --      Exclude from Growth Chart --    No data found.  Updated Vital Signs BP (!) 106/59 (BP Location: Left Arm)   Pulse 70   Temp 99 F (37.2 C) (Oral)   Resp 16   Wt 120 lb (54.4 kg)   LMP  (LMP Unknown)   SpO2 98%   Breastfeeding No   BMI 26.90 kg/m   Visual Acuity Right Eye Distance:   Left Eye Distance:   Bilateral Distance:    Right Eye Near:   Left Eye Near:    Bilateral Near:     Physical Exam   UC Treatments / Results  Labs (all labs ordered are listed, but only abnormal results are displayed) Labs Reviewed - No data to display  EKG   Radiology No results found.  Procedures Procedures (including critical care time)  Medications Ordered in UC Medications - No data to display  Initial Impression / Assessment and Plan / UC Course  I have reviewed the triage vital signs and the nursing notes.  Pertinent labs & imaging results that were available during my care of the patient were reviewed by me and considered in my medical decision making (see chart for details).     *** Final Clinical Impressions(s) / UC Diagnoses   Final diagnoses:  Chest wall pain     Discharge Instructions      As we discussed, the pain in your chest is likely muscular pain.  The EKG today looks stable.  You can take Tylenol [phone] mg every 6 hours for pain and ibuprofen 800 mg every 8 hours for pain.  Also start using warm compresses regularly.  Start the muscle relaxant to help relax your muscles and help with pain.  Follow up with PCP if pain does not improve with this  treatment.   ED Prescriptions     Medication Sig Dispense Auth. Provider   tiZANidine (ZANAFLEX) 4 MG tablet Take 1 tablet (4 mg total) by mouth every 8 (eight) hours as needed for muscle spasms. Do not take with alcohol or while driving or operating heavy machinery.  May cause drowsiness. 30 tablet Valentino Nose, NP      PDMP not reviewed this encounter.

## 2022-10-29 NOTE — Discharge Instructions (Addendum)
As we discussed, the pain in your chest is likely muscular pain.  The EKG today looks stable.  You can take Tylenol [phone] mg every 6 hours for pain and ibuprofen 800 mg every 8 hours for pain.  Also start using warm compresses regularly.  Start the muscle relaxant to help relax your muscles and help with pain.  Follow up with PCP if pain does not improve with this treatment.

## 2022-10-29 NOTE — ED Triage Notes (Signed)
Patient here today with c/o right side chest pain that started 2-3 days ago. Patient radiates to the back and up the right side of her neck. Increased pain with certain movement. Hurts to take deep breathes.   She is also having some right knee pain that started last night. NKI. Pain is located in one specific spot on her knee.

## 2023-01-07 DIAGNOSIS — Z3042 Encounter for surveillance of injectable contraceptive: Secondary | ICD-10-CM | POA: Diagnosis not present

## 2023-04-03 DIAGNOSIS — Z3042 Encounter for surveillance of injectable contraceptive: Secondary | ICD-10-CM | POA: Diagnosis not present

## 2023-10-13 DIAGNOSIS — H5213 Myopia, bilateral: Secondary | ICD-10-CM | POA: Diagnosis not present

## 2023-10-14 DIAGNOSIS — Z3042 Encounter for surveillance of injectable contraceptive: Secondary | ICD-10-CM | POA: Diagnosis not present

## 2023-10-14 DIAGNOSIS — Z3202 Encounter for pregnancy test, result negative: Secondary | ICD-10-CM | POA: Diagnosis not present

## 2023-12-16 ENCOUNTER — Emergency Department (HOSPITAL_COMMUNITY)

## 2023-12-16 ENCOUNTER — Emergency Department (HOSPITAL_COMMUNITY)
Admission: EM | Admit: 2023-12-16 | Discharge: 2023-12-16 | Disposition: A | Discharge status: Home / Self Care | Attending: Emergency Medicine | Admitting: Emergency Medicine

## 2023-12-16 ENCOUNTER — Other Ambulatory Visit: Payer: Self-pay

## 2023-12-16 DIAGNOSIS — M25522 Pain in left elbow: Secondary | ICD-10-CM | POA: Diagnosis not present

## 2023-12-16 DIAGNOSIS — S0993XA Unspecified injury of face, initial encounter: Secondary | ICD-10-CM | POA: Diagnosis not present

## 2023-12-16 DIAGNOSIS — M79602 Pain in left arm: Secondary | ICD-10-CM | POA: Diagnosis not present

## 2023-12-16 DIAGNOSIS — M25532 Pain in left wrist: Secondary | ICD-10-CM | POA: Diagnosis not present

## 2023-12-16 DIAGNOSIS — J439 Emphysema, unspecified: Secondary | ICD-10-CM | POA: Diagnosis not present

## 2023-12-16 DIAGNOSIS — Y92410 Unspecified street and highway as the place of occurrence of the external cause: Secondary | ICD-10-CM | POA: Insufficient documentation

## 2023-12-16 DIAGNOSIS — M25512 Pain in left shoulder: Secondary | ICD-10-CM | POA: Diagnosis not present

## 2023-12-16 DIAGNOSIS — Z041 Encounter for examination and observation following transport accident: Secondary | ICD-10-CM | POA: Diagnosis not present

## 2023-12-16 DIAGNOSIS — S199XXA Unspecified injury of neck, initial encounter: Secondary | ICD-10-CM | POA: Diagnosis not present

## 2023-12-16 MED ORDER — NAPROXEN 500 MG PO TABS: 500.0000 mg | ORAL_TABLET | Freq: Two times a day (BID) | ORAL | 0 refills | Status: AC

## 2023-12-16 MED ORDER — KETOROLAC TROMETHAMINE 15 MG/ML IJ SOLN
15.0000 mg | Freq: Once | INTRAMUSCULAR | Status: DC
  Filled 2023-12-16: qty 1

## 2023-12-16 MED ORDER — METHOCARBAMOL 500 MG PO TABS: 500.0000 mg | ORAL_TABLET | Freq: Two times a day (BID) | ORAL | 0 refills | Status: AC

## 2023-12-16 NOTE — Discharge Instructions (Signed)
 You were given a short prescription for anti-inflammatories, please take 1 tablet twice a day with food for the next 7 days.  You are also given a short course of muscle relaxers, please be aware this medication can be sedating.  You may take 1 tablet twice a day to help with muscle spasms.  To the emergency department if any of your symptoms worsen.
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
## 2024-01-07 DIAGNOSIS — Z3042 Encounter for surveillance of injectable contraceptive: Secondary | ICD-10-CM | POA: Diagnosis not present
